# Patient Record
Sex: Male | Born: 1955 | Race: White | Hispanic: No | Smoking: Never smoker
Health system: Southern US, Community
[De-identification: ages and names within clinical notes are randomized; demographics above are authoritative.]

## PROBLEM LIST (undated history)

## (undated) DIAGNOSIS — C801 Malignant (primary) neoplasm, unspecified: Secondary | ICD-10-CM

## (undated) DIAGNOSIS — I71019 Dissection of thoracic aorta, unspecified: Secondary | ICD-10-CM

## (undated) DIAGNOSIS — M1990 Unspecified osteoarthritis, unspecified site: Secondary | ICD-10-CM

## (undated) HISTORY — PX: SHOULDER SURGERY: SHX246

## (undated) HISTORY — PX: OTHER SURGICAL HISTORY: SHX169

---

## 2003-11-01 ENCOUNTER — Encounter: Admission: RE | Admit: 2003-11-01 | Discharge: 2003-11-01 | Payer: Self-pay | Admitting: Family Medicine

## 2003-11-12 ENCOUNTER — Encounter: Admission: RE | Admit: 2003-11-12 | Discharge: 2003-11-12 | Payer: Self-pay | Admitting: Family Medicine

## 2004-05-12 ENCOUNTER — Encounter: Admission: RE | Admit: 2004-05-12 | Discharge: 2004-05-12 | Payer: Self-pay | Admitting: Family Medicine

## 2005-06-29 ENCOUNTER — Encounter: Admission: RE | Admit: 2005-06-29 | Discharge: 2005-06-29 | Payer: Self-pay | Admitting: Family Medicine

## 2010-06-14 ENCOUNTER — Encounter: Payer: Self-pay | Admitting: Family Medicine

## 2016-03-03 ENCOUNTER — Ambulatory Visit (INDEPENDENT_AMBULATORY_CARE_PROVIDER_SITE_OTHER): Payer: PRIVATE HEALTH INSURANCE | Admitting: Orthopaedic Surgery

## 2016-03-03 DIAGNOSIS — M1712 Unilateral primary osteoarthritis, left knee: Secondary | ICD-10-CM

## 2018-07-05 ENCOUNTER — Other Ambulatory Visit: Payer: Self-pay | Admitting: Urology

## 2018-07-05 DIAGNOSIS — R972 Elevated prostate specific antigen [PSA]: Secondary | ICD-10-CM

## 2018-07-19 ENCOUNTER — Ambulatory Visit
Admission: RE | Admit: 2018-07-19 | Discharge: 2018-07-19 | Disposition: A | Discharge status: Home / Self Care | Payer: PRIVATE HEALTH INSURANCE | Source: Ambulatory Visit | Attending: Urology | Admitting: Urology

## 2018-07-19 DIAGNOSIS — R972 Elevated prostate specific antigen [PSA]: Secondary | ICD-10-CM

## 2018-07-19 MED ORDER — GADOBENATE DIMEGLUMINE 529 MG/ML IV SOLN
15.0000 mL | Freq: Once | INTRAVENOUS | Status: AC | PRN
  Administered 2018-07-19: 15 mL via INTRAVENOUS

## 2019-01-05 ENCOUNTER — Other Ambulatory Visit: Payer: Self-pay | Admitting: Urology

## 2019-01-05 DIAGNOSIS — C61 Malignant neoplasm of prostate: Secondary | ICD-10-CM

## 2019-04-25 ENCOUNTER — Ambulatory Visit
Admission: RE | Admit: 2019-04-25 | Discharge: 2019-04-25 | Disposition: A | Discharge status: Home / Self Care | Payer: PRIVATE HEALTH INSURANCE | Source: Ambulatory Visit | Attending: Urology | Admitting: Urology

## 2019-04-25 DIAGNOSIS — C61 Malignant neoplasm of prostate: Secondary | ICD-10-CM

## 2019-04-25 MED ORDER — GADOBENATE DIMEGLUMINE 529 MG/ML IV SOLN
15.0000 mL | Freq: Once | INTRAVENOUS | Status: AC | PRN
  Administered 2019-04-25: 15 mL via INTRAVENOUS

## 2019-05-12 ENCOUNTER — Other Ambulatory Visit: Payer: Self-pay | Admitting: Urology

## 2019-06-06 ENCOUNTER — Other Ambulatory Visit (HOSPITAL_COMMUNITY)
Admission: RE | Admit: 2019-06-06 | Discharge: 2019-06-06 | Disposition: A | Discharge status: Home / Self Care | Payer: PRIVATE HEALTH INSURANCE | Source: Ambulatory Visit | Attending: Urology | Admitting: Urology

## 2019-06-06 DIAGNOSIS — Z01812 Encounter for preprocedural laboratory examination: Secondary | ICD-10-CM | POA: Diagnosis present

## 2019-06-06 DIAGNOSIS — Z20822 Contact with and (suspected) exposure to covid-19: Secondary | ICD-10-CM | POA: Insufficient documentation

## 2019-06-06 NOTE — Patient Instructions (Signed)
DUE TO COVID-19 ONLY ONE VISITOR IS ALLOWED TO COME WITH YOU AND STAY IN THE WAITING ROOM ONLY DURING PRE OP AND PROCEDURE DAY OF SURGERY. THE 1 VISITOR MAY VISIT WITH YOU AFTER SURGERY IN YOUR PRIVATE ROOM DURING VISITING HOURS ONLY!  YOU NEED TO HAVE A COVID 19 TEST ON_______ @_______ , THIS TEST MUST BE DONE BEFORE SURGERY, COME  [address], [place] , [id number].  ([place]) ONCE YOUR COVID TEST IS COMPLETED, PLEASE BEGIN THE QUARANTINE INSTRUCTIONS AS OUTLINED IN YOUR HANDOUT.                RAYMONE RENZI  06/06/2019   Your procedure is scheduled on: 06-09-19   Report to [facility] Main  Entrance   Report to short stay at        0530 AM     Call this number if you have problems the morning of surgery [phone]    Remember: Do not eat food or drink liquids :After Midnight.   BRUSH YOUR TEETH MORNING OF SURGERY AND RINSE YOUR MOUTH OUT, NO CHEWING GUM CANDY OR MINTS.     Take these medicines the morning of surgery with A SIP OF WATER: NONE unless you need a xanax                                 You may not have any metal on your body including hair pins and              piercings  Do not wear jewelry,  lotions, powders or perfumes, deodorant                     Men may shave face and neck.   Do not bring valuables to the hospital. [place].  Contacts, dentures or bridgework may not be worn into surgery.      Patients discharged the day of surgery will not be allowed to drive home. IF YOU ARE HAVING SURGERY AND GOING HOME THE SAME DAY, YOU MUST HAVE AN ADULT TO DRIVE YOU HOME AND BE WITH YOU FOR 24 HOURS. YOU MAY GO HOME BY TAXI OR UBER OR ORTHERWISE, BUT AN ADULT MUST ACCOMPANY YOU HOME AND STAY WITH YOU FOR 24 HOURS.  Name and phone number of your driver:  Special Instructions: N/A              Please read over the following fact sheets you were  given: _____________________________________________________________________             [facility] - Preparing for Surgery Before surgery, you can play an important role.  Because skin is not sterile, your skin needs to be as free of germs as possible.  You can reduce the number of germs on your skin by washing with CHG (chlorahexidine gluconate) soap before surgery.  CHG is an antiseptic cleaner which kills germs and bonds with the skin to continue killing germs even after washing. Please DO NOT use if you have an allergy to CHG or antibacterial soaps.  If your skin becomes reddened/irritated stop using the CHG and inform your nurse when you arrive at Short Stay. Do not shave (including legs and underarms) for at least 48 hours prior to the first CHG shower.  You may shave your  face/neck. Please follow these instructions carefully:  1.  Shower with CHG Soap the night before surgery and the  morning of Surgery.  2.  If you choose to wash your hair, wash your hair first as usual with your  normal  shampoo.  3.  After you shampoo, rinse your hair and body thoroughly to remove the  shampoo.                           4.  Use CHG as you would any other liquid soap.  You can apply chg directly  to the skin and wash                       Gently with a scrungie or clean washcloth.  5.  Apply the CHG Soap to your body ONLY FROM THE NECK DOWN.   Do not use on face/ open                           Wound or open sores. Avoid contact with eyes, ears mouth and genitals (private parts).                       Wash face,  Genitals (private parts) with your normal soap.             6.  Wash thoroughly, paying special attention to the area where your surgery  will be performed.  7.  Thoroughly rinse your body with warm water from the neck down.  8.  DO NOT shower/wash with your normal soap after using and rinsing off  the CHG Soap.                9.  Pat yourself dry with a clean towel.            10.  Wear  clean pajamas.            11.  Place clean sheets on your bed the night of your first shower and do not  sleep with pets. Day of Surgery : Do not apply any lotions/deodorants the morning of surgery.  Please wear clean clothes to the hospital/surgery center.  FAILURE TO FOLLOW THESE INSTRUCTIONS MAY RESULT IN THE CANCELLATION OF YOUR SURGERY PATIENT SIGNATURE_________________________________  NURSE SIGNATURE__________________________________  ________________________________________________________________________

## 2019-06-07 ENCOUNTER — Encounter (HOSPITAL_COMMUNITY): Payer: Self-pay

## 2019-06-07 ENCOUNTER — Other Ambulatory Visit: Payer: Self-pay

## 2019-06-07 ENCOUNTER — Encounter (HOSPITAL_COMMUNITY)
Admission: RE | Admit: 2019-06-07 | Discharge: 2019-06-07 | Disposition: A | Discharge status: Home / Self Care | Payer: PRIVATE HEALTH INSURANCE | Source: Ambulatory Visit | Attending: Urology | Admitting: Urology

## 2019-06-07 DIAGNOSIS — Z01812 Encounter for preprocedural laboratory examination: Secondary | ICD-10-CM | POA: Insufficient documentation

## 2019-06-07 HISTORY — DX: Malignant (primary) neoplasm, unspecified: C80.1

## 2019-06-07 HISTORY — DX: Unspecified osteoarthritis, unspecified site: M19.90

## 2019-06-07 LAB — ABO/RH: ABO/RH(D): A POS

## 2019-06-07 LAB — COMPREHENSIVE METABOLIC PANEL
ALT: 38 U/L (ref 0–44)
AST: 31 U/L (ref 15–41)
Albumin: 4.3 g/dL (ref 3.5–5.0)
Alkaline Phosphatase: 63 U/L (ref 38–126)
Anion gap: 8 (ref 5–15)
BUN: 14 mg/dL (ref 8–23)
CO2: 25 mmol/L (ref 22–32)
Calcium: 9.1 mg/dL (ref 8.9–10.3)
Chloride: 104 mmol/L (ref 98–111)
Creatinine, Ser: 0.84 mg/dL (ref 0.61–1.24)
GFR calc Af Amer: >60 mL/min (ref >60)
GFR calc non Af Amer: >60 mL/min (ref >60)
Glucose, Bld: 95 mg/dL (ref 70–99)
Potassium: 4.1 mmol/L (ref 3.5–5.1)
Sodium: 137 mmol/L (ref 135–145)
Total Bilirubin: 0.9 mg/dL (ref 0.3–1.2)
Total Protein: 6.8 g/dL (ref 6.5–8.1)

## 2019-06-07 LAB — CBC
HCT: 48.7 % (ref 39.0–52.0)
Hemoglobin: 15.5 g/dL (ref 13.0–17.0)
MCH: 30.9 pg (ref 26.0–34.0)
MCHC: 31.8 g/dL (ref 30.0–36.0)
MCV: 97 fL (ref 80.0–100.0)
Platelets: 261 10*3/uL (ref 150–400)
RBC: 5.02 MIL/uL (ref 4.22–5.81)
RDW: 12.8 % (ref 11.5–15.5)
WBC: 5.7 10*3/uL (ref 4.0–10.5)
nRBC: 0 % (ref 0.0–0.2)

## 2019-06-07 LAB — NOVEL CORONAVIRUS, NAA (HOSP ORDER, SEND-OUT TO REF LAB; TAT 18-24 HRS): SARS-CoV-2, NAA: NOT DETECTED

## 2019-06-07 NOTE — Progress Notes (Signed)
PCP - Dr. Stephanie Acre, [facility] Cardiologist -   Chest x-ray -  EKG -  Stress Test -  ECHO -  Cardiac Cath -   Sleep Study -  CPAP -   Fasting Blood Sugar -  Checks Blood Sugar _____ times a day  Blood Thinner Instructions: 81mg  asa stoppes 06-01-18 Aspirin Instructions: Last Dose:  Anesthesia review:   Patient denies shortness of breath, fever, cough and chest pain at PAT appointment   DENIES   Patient verbalized understanding of instructions that were given to them at the PAT appointment. Patient was also instructed that they will need to review over the PAT instructions again at home before surgery.

## 2019-06-07 NOTE — Patient Instructions (Signed)
DUE TO COVID-19 ONLY ONE VISITOR IS ALLOWED TO COME WITH YOU AND STAY IN THE WAITING ROOM ONLY DURING PRE OP AND PROCEDURE DAY OF SURGERY. THE 1 VISITOR MAY VISIT WITH YOU AFTER SURGERY IN YOUR PRIVATE ROOM DURING VISITING HOURS ONLY!  YOU NEED TO HAVE A COVID 19 TEST ON_______ @_______ , THIS TEST MUST BE DONE BEFORE SURGERY, COME  [place], [place] , [id number].  ([place]) ONCE YOUR COVID TEST IS COMPLETED, PLEASE BEGIN THE QUARANTINE INSTRUCTIONS AS OUTLINED IN YOUR HANDOUT.                LANCE MONTFORD  06/07/2019   Your procedure is scheduled on:    Report to [facility] Main  Entrance   Report to admitting at AM     Call this number if you have problems the morning of surgery [phone]    Remember: Do not eat food or drink liquids :After Midnight. BRUSH YOUR TEETH MORNING OF SURGERY AND RINSE YOUR MOUTH OUT, NO CHEWING GUM CANDY OR MINTS.     Take these medicines the morning of surgery with A SIP OF WATER:   DO NOT TAKE ANY DIABETIC MEDICATIONS DAY OF YOUR SURGERY                               You may not have any metal on your body including hair pins and              piercings  Do not wear jewelry, make-up, lotions, powders or perfumes, deodorant             Do not wear nail polish on your fingernails.  Do not shave  48 hours prior to surgery.              Men may shave face and neck.   Do not bring valuables to the hospital. [place].  Contacts, dentures or bridgework may not be worn into surgery.  Leave suitcase in the car. After surgery it may be brought to your room.     Patients discharged the day of surgery will not be allowed to drive home. IF YOU ARE HAVING SURGERY AND GOING HOME THE SAME DAY, YOU MUST HAVE AN ADULT TO DRIVE YOU HOME AND BE WITH YOU FOR 24 HOURS. YOU MAY GO HOME BY TAXI OR UBER OR ORTHERWISE, BUT AN ADULT MUST ACCOMPANY YOU HOME AND STAY WITH YOU FOR 24  HOURS.  Name and phone number of your driver:  Special Instructions: N/A              Please read over the following fact sheets you were given: _____________________________________________________________________

## 2019-06-08 ENCOUNTER — Encounter (HOSPITAL_COMMUNITY): Payer: Self-pay | Admitting: Urology

## 2019-06-08 LAB — URINE CULTURE: Culture: NO GROWTH

## 2019-06-08 NOTE — Anesthesia Preprocedure Evaluation (Addendum)
Anesthesia Evaluation  Patient identified by MRN, date of birth, ID band Patient awake    Reviewed: Allergy & Precautions, NPO status , Patient's Chart, lab work & pertinent test results  Airway Mallampati: I       Dental no notable dental hx. (+) Teeth Intact   Pulmonary neg pulmonary ROS,    Pulmonary exam normal breath sounds clear to auscultation       Cardiovascular Normal cardiovascular exam Rhythm:Regular Rate:Normal     Neuro/Psych negative neurological ROS     GI/Hepatic negative GI ROS, Neg liver ROS,   Endo/Other  negative endocrine ROS  Renal/GU negative Renal ROS     Musculoskeletal   Abdominal Normal abdominal exam  (+)   Peds  Hematology negative hematology ROS (+)   Anesthesia Other Findings   Reproductive/Obstetrics                            Anesthesia Physical Anesthesia Plan  ASA: II  Anesthesia Plan: General   Post-op Pain Management:    Induction: Intravenous  PONV Risk Score and Plan: 4 or greater and Ondansetron, Dexamethasone and Midazolam  Airway Management Planned: Oral ETT  Additional Equipment: None  Intra-op Plan:   Post-operative Plan: Extubation in OR  Informed Consent: I have reviewed the patients History and Physical, chart, labs and discussed the procedure including the risks, benefits and alternatives for the proposed anesthesia with the patient or authorized representative who has indicated his/her understanding and acceptance.     Dental advisory given  Plan Discussed with: CRNA  Anesthesia Plan Comments:        Anesthesia Quick Evaluation

## 2019-06-09 ENCOUNTER — Encounter (HOSPITAL_COMMUNITY)
Admission: AD | Disposition: A | Discharge status: Home / Self Care | Payer: Self-pay | Source: Ambulatory Visit | Attending: Urology

## 2019-06-09 ENCOUNTER — Ambulatory Visit (HOSPITAL_COMMUNITY): Payer: PRIVATE HEALTH INSURANCE | Admitting: Physician Assistant

## 2019-06-09 ENCOUNTER — Other Ambulatory Visit: Payer: Self-pay

## 2019-06-09 ENCOUNTER — Encounter (HOSPITAL_COMMUNITY): Payer: Self-pay | Admitting: Urology

## 2019-06-09 ENCOUNTER — Inpatient Hospital Stay (HOSPITAL_COMMUNITY)
Admission: AD | Admit: 2019-06-09 | Discharge: 2019-06-12 | DRG: 708 | Disposition: A | Discharge status: Home / Self Care | Payer: PRIVATE HEALTH INSURANCE | Attending: Urology | Admitting: Urology

## 2019-06-09 DIAGNOSIS — Z8042 Family history of malignant neoplasm of prostate: Secondary | ICD-10-CM

## 2019-06-09 DIAGNOSIS — M1991 Primary osteoarthritis, unspecified site: Secondary | ICD-10-CM | POA: Diagnosis present

## 2019-06-09 DIAGNOSIS — N529 Male erectile dysfunction, unspecified: Secondary | ICD-10-CM | POA: Diagnosis present

## 2019-06-09 DIAGNOSIS — Z79899 Other long term (current) drug therapy: Secondary | ICD-10-CM

## 2019-06-09 DIAGNOSIS — C61 Malignant neoplasm of prostate: Secondary | ICD-10-CM

## 2019-06-09 DIAGNOSIS — E7800 Pure hypercholesterolemia, unspecified: Secondary | ICD-10-CM | POA: Diagnosis present

## 2019-06-09 DIAGNOSIS — Z7982 Long term (current) use of aspirin: Secondary | ICD-10-CM

## 2019-06-09 HISTORY — PX: SMALL BOWEL REPAIR: SHX6447

## 2019-06-09 HISTORY — PX: PELVIC LYMPH NODE DISSECTION: SHX6543

## 2019-06-09 HISTORY — PX: ROBOT ASSISTED LAPAROSCOPIC RADICAL PROSTATECTOMY: SHX5141

## 2019-06-09 LAB — TYPE AND SCREEN
ABO/RH(D): A POS
Antibody Screen: NEGATIVE

## 2019-06-09 LAB — HEMOGLOBIN AND HEMATOCRIT, BLOOD
HCT: 40.8 % (ref 39.0–52.0)
Hemoglobin: 12.8 g/dL — ABNORMAL LOW (ref 13.0–17.0)

## 2019-06-09 SURGERY — PROSTATECTOMY, RADICAL, ROBOT-ASSISTED, LAPAROSCOPIC
Anesthesia: General | Site: Abdomen

## 2019-06-09 MED ORDER — HYDROMORPHONE HCL 1 MG/ML IJ SOLN: 0.5000 mg | INTRAMUSCULAR | Status: DC | PRN

## 2019-06-09 MED ORDER — BUPIVACAINE-EPINEPHRINE 0.5% -1:200000 IJ SOLN
INTRAMUSCULAR | Status: AC
  Filled 2019-06-09: qty 1

## 2019-06-09 MED ORDER — ONDANSETRON HCL 4 MG/2ML IJ SOLN
INTRAMUSCULAR | Status: DC | PRN
  Administered 2019-06-09 (×2): 4 mg via INTRAVENOUS

## 2019-06-09 MED ORDER — ROCURONIUM BROMIDE 10 MG/ML (PF) SYRINGE
PREFILLED_SYRINGE | INTRAVENOUS | Status: AC
  Filled 2019-06-09: qty 10

## 2019-06-09 MED ORDER — DIPHENHYDRAMINE HCL 12.5 MG/5ML PO ELIX: 12.5000 mg | ORAL_SOLUTION | Freq: Four times a day (QID) | ORAL | Status: DC | PRN

## 2019-06-09 MED ORDER — CEFAZOLIN SODIUM-DEXTROSE 2-4 GM/100ML-% IV SOLN
INTRAVENOUS | Status: AC
  Filled 2019-06-09: qty 100

## 2019-06-09 MED ORDER — ATORVASTATIN CALCIUM 20 MG PO TABS
20.0000 mg | ORAL_TABLET | Freq: Every day | ORAL | Status: DC
  Administered 2019-06-09 – 2019-06-11 (×3): 20 mg via ORAL
  Filled 2019-06-09 (×3): qty 1

## 2019-06-09 MED ORDER — SODIUM CHLORIDE 0.9 % IV BOLUS
1000.0000 mL | Freq: Once | INTRAVENOUS | Status: AC
  Administered 2019-06-09: 12:00:00 1000 mL via INTRAVENOUS

## 2019-06-09 MED ORDER — BACITRACIN-NEOMYCIN-POLYMYXIN 400-5-5000 EX OINT: 1.0000 "application " | TOPICAL_OINTMENT | Freq: Three times a day (TID) | CUTANEOUS | Status: DC | PRN

## 2019-06-09 MED ORDER — MIDAZOLAM HCL 2 MG/2ML IJ SOLN
INTRAMUSCULAR | Status: DC | PRN
  Administered 2019-06-09: 2 mg via INTRAVENOUS

## 2019-06-09 MED ORDER — TRAMADOL HCL 50 MG PO TABS
50.0000 mg | ORAL_TABLET | Freq: Four times a day (QID) | ORAL | Status: DC | PRN
  Administered 2019-06-11: 50 mg via ORAL
  Administered 2019-06-12: 100 mg via ORAL
  Filled 2019-06-09: qty 2
  Filled 2019-06-09: qty 1

## 2019-06-09 MED ORDER — PHENYLEPHRINE HCL-NACL 10-0.9 MG/250ML-% IV SOLN
INTRAVENOUS | Status: DC | PRN
  Administered 2019-06-09: 50 ug/min via INTRAVENOUS

## 2019-06-09 MED ORDER — PROMETHAZINE HCL 25 MG/ML IJ SOLN: 6.2500 mg | INTRAMUSCULAR | Status: DC | PRN

## 2019-06-09 MED ORDER — ACETAMINOPHEN 10 MG/ML IV SOLN
1000.0000 mg | Freq: Four times a day (QID) | INTRAVENOUS | Status: AC
  Administered 2019-06-09 – 2019-06-10 (×3): 1000 mg via INTRAVENOUS
  Filled 2019-06-09 (×4): qty 100

## 2019-06-09 MED ORDER — BUPIVACAINE LIPOSOME 1.3 % IJ SUSP
20.0000 mL | Freq: Once | INTRAMUSCULAR | Status: AC
  Administered 2019-06-09: 20 mL
  Filled 2019-06-09: qty 20

## 2019-06-09 MED ORDER — HEMOSTATIC AGENTS (NO CHARGE) OPTIME
TOPICAL | Status: DC | PRN
  Administered 2019-06-09: 1 via TOPICAL

## 2019-06-09 MED ORDER — SULFAMETHOXAZOLE-TRIMETHOPRIM 800-160 MG PO TABS: 1.0000 | ORAL_TABLET | Freq: Two times a day (BID) | ORAL | 0 refills | Status: DC

## 2019-06-09 MED ORDER — BELLADONNA ALKALOIDS-OPIUM 16.2-60 MG RE SUPP: 1.0000 | Freq: Four times a day (QID) | RECTAL | Status: DC | PRN

## 2019-06-09 MED ORDER — CEFAZOLIN SODIUM-DEXTROSE 1-4 GM/50ML-% IV SOLN
1.0000 g | Freq: Three times a day (TID) | INTRAVENOUS | Status: DC
  Administered 2019-06-09 – 2019-06-11 (×5): 1 g via INTRAVENOUS
  Filled 2019-06-09 (×7): qty 50

## 2019-06-09 MED ORDER — PROPOFOL 10 MG/ML IV BOLUS
INTRAVENOUS | Status: AC
  Filled 2019-06-09: qty 20

## 2019-06-09 MED ORDER — HYDROMORPHONE HCL 1 MG/ML IJ SOLN
INTRAMUSCULAR | Status: AC
  Filled 2019-06-09: qty 1

## 2019-06-09 MED ORDER — SUGAMMADEX SODIUM 500 MG/5ML IV SOLN
INTRAVENOUS | Status: AC
  Filled 2019-06-09: qty 5

## 2019-06-09 MED ORDER — ROCURONIUM BROMIDE 10 MG/ML (PF) SYRINGE
PREFILLED_SYRINGE | INTRAVENOUS | Status: DC | PRN
  Administered 2019-06-09: 10 mg via INTRAVENOUS
  Administered 2019-06-09: 20 mg via INTRAVENOUS
  Administered 2019-06-09: 10 mg via INTRAVENOUS
  Administered 2019-06-09: 20 mg via INTRAVENOUS
  Administered 2019-06-09: 70 mg via INTRAVENOUS

## 2019-06-09 MED ORDER — DIPHENHYDRAMINE HCL 50 MG/ML IJ SOLN: 12.5000 mg | Freq: Four times a day (QID) | INTRAMUSCULAR | Status: DC | PRN

## 2019-06-09 MED ORDER — ALPRAZOLAM 1 MG PO TABS
1.0000 mg | ORAL_TABLET | Freq: Every evening | ORAL | Status: DC | PRN
  Administered 2019-06-09 – 2019-06-11 (×3): 1 mg via ORAL
  Filled 2019-06-09 (×3): qty 1

## 2019-06-09 MED ORDER — LIDOCAINE 2% (20 MG/ML) 5 ML SYRINGE
INTRAMUSCULAR | Status: DC | PRN
  Administered 2019-06-09: 100 mg via INTRAVENOUS

## 2019-06-09 MED ORDER — KETOROLAC TROMETHAMINE 15 MG/ML IJ SOLN
15.0000 mg | Freq: Four times a day (QID) | INTRAMUSCULAR | Status: AC
  Administered 2019-06-09 – 2019-06-10 (×5): 15 mg via INTRAVENOUS
  Filled 2019-06-09 (×5): qty 1

## 2019-06-09 MED ORDER — CEFAZOLIN SODIUM-DEXTROSE 2-4 GM/100ML-% IV SOLN
2.0000 g | INTRAVENOUS | Status: AC
  Administered 2019-06-09 (×2): 2 g via INTRAVENOUS
  Filled 2019-06-09: qty 100

## 2019-06-09 MED ORDER — ACETAMINOPHEN 10 MG/ML IV SOLN
1000.0000 mg | Freq: Four times a day (QID) | INTRAVENOUS | Status: DC
  Filled 2019-06-09: qty 100

## 2019-06-09 MED ORDER — FENTANYL CITRATE (PF) 250 MCG/5ML IJ SOLN
INTRAMUSCULAR | Status: DC | PRN
  Administered 2019-06-09: 50 ug via INTRAVENOUS
  Administered 2019-06-09 (×2): 25 ug via INTRAVENOUS
  Administered 2019-06-09: 200 ug via INTRAVENOUS
  Administered 2019-06-09: 100 ug via INTRAVENOUS
  Administered 2019-06-09 (×2): 50 ug via INTRAVENOUS

## 2019-06-09 MED ORDER — MIDAZOLAM HCL 2 MG/2ML IJ SOLN
INTRAMUSCULAR | Status: AC
  Filled 2019-06-09: qty 2

## 2019-06-09 MED ORDER — DOCUSATE SODIUM 100 MG PO CAPS
100.0000 mg | ORAL_CAPSULE | Freq: Two times a day (BID) | ORAL | Status: DC
  Administered 2019-06-09 – 2019-06-11 (×5): 100 mg via ORAL
  Filled 2019-06-09 (×5): qty 1

## 2019-06-09 MED ORDER — SUGAMMADEX SODIUM 500 MG/5ML IV SOLN
INTRAVENOUS | Status: DC | PRN
  Administered 2019-06-09: 300 mg via INTRAVENOUS

## 2019-06-09 MED ORDER — FENTANYL CITRATE (PF) 250 MCG/5ML IJ SOLN
INTRAMUSCULAR | Status: AC
  Filled 2019-06-09: qty 5

## 2019-06-09 MED ORDER — DEXTROSE-NACL 5-0.45 % IV SOLN: INTRAVENOUS | Status: DC

## 2019-06-09 MED ORDER — MEPERIDINE HCL 50 MG/ML IJ SOLN: 6.2500 mg | INTRAMUSCULAR | Status: DC | PRN

## 2019-06-09 MED ORDER — HYDROMORPHONE HCL 1 MG/ML IJ SOLN
0.2500 mg | INTRAMUSCULAR | Status: DC | PRN
  Administered 2019-06-09 (×2): 0.5 mg via INTRAVENOUS

## 2019-06-09 MED ORDER — EPHEDRINE SULFATE-NACL 50-0.9 MG/10ML-% IV SOSY
PREFILLED_SYRINGE | INTRAVENOUS | Status: DC | PRN
  Administered 2019-06-09 (×2): 10 mg via INTRAVENOUS

## 2019-06-09 MED ORDER — TRAMADOL HCL 50 MG PO TABS: 50.0000 mg | ORAL_TABLET | Freq: Four times a day (QID) | ORAL | 0 refills | Status: DC | PRN

## 2019-06-09 MED ORDER — LACTATED RINGERS IR SOLN
Status: DC | PRN
  Administered 2019-06-09: 1000 mL

## 2019-06-09 MED ORDER — ONDANSETRON HCL 4 MG/2ML IJ SOLN: 4.0000 mg | INTRAMUSCULAR | Status: DC | PRN

## 2019-06-09 MED ORDER — LACTATED RINGERS IV SOLN
INTRAVENOUS | Status: DC
  Administered 2019-06-09: 12:00:00 1000 mL via INTRAVENOUS

## 2019-06-09 MED ORDER — DEXAMETHASONE SODIUM PHOSPHATE 10 MG/ML IJ SOLN
INTRAMUSCULAR | Status: DC | PRN
  Administered 2019-06-09: 10 mg via INTRAVENOUS

## 2019-06-09 MED ORDER — PROPOFOL 10 MG/ML IV BOLUS
INTRAVENOUS | Status: DC | PRN
  Administered 2019-06-09: 150 mg via INTRAVENOUS

## 2019-06-09 MED ORDER — BUPIVACAINE-EPINEPHRINE 0.5% -1:200000 IJ SOLN
INTRAMUSCULAR | Status: DC | PRN
  Administered 2019-06-09: 20 mL

## 2019-06-09 MED ORDER — KETOROLAC TROMETHAMINE 30 MG/ML IJ SOLN: 30.0000 mg | Freq: Once | INTRAMUSCULAR | Status: DC | PRN

## 2019-06-09 SURGICAL SUPPLY — 66 items
ADH SKN CLS APL DERMABOND .7 (GAUZE/BANDAGES/DRESSINGS) ×3
APL PRP STRL LF DISP 70% ISPRP (MISCELLANEOUS) ×3
APL SRG 38 LTWT LNG FL B (MISCELLANEOUS) ×3
APPLICATOR ARISTA FLEXITIP XL (MISCELLANEOUS) ×2 IMPLANT
CATH FOLEY 2WAY SLVR 18FR 30CC (CATHETERS) ×5 IMPLANT
CATH ROBINSON RED A/P 16FR (CATHETERS) ×2 IMPLANT
CATH TIEMANN FOLEY 18FR 5CC (CATHETERS) ×5 IMPLANT
CHLORAPREP W/TINT 26 (MISCELLANEOUS) ×5 IMPLANT
CLIP VESOLOCK LG 6/CT PURPLE (CLIP) ×12 IMPLANT
COVER SURGICAL LIGHT HANDLE (MISCELLANEOUS) ×5 IMPLANT
COVER TIP SHEARS 8 DVNC (MISCELLANEOUS) ×3 IMPLANT
COVER TIP SHEARS 8MM DA VINCI (MISCELLANEOUS) ×2
COVER WAND RF STERILE (DRAPES) IMPLANT
CUTTER ECHEON FLEX ENDO 45 340 (ENDOMECHANICALS) ×5 IMPLANT
DECANTER SPIKE VIAL GLASS SM (MISCELLANEOUS) ×5 IMPLANT
DERMABOND ADVANCED (GAUZE/BANDAGES/DRESSINGS) ×2
DERMABOND ADVANCED .7 DNX12 (GAUZE/BANDAGES/DRESSINGS) ×3 IMPLANT
DRAIN CHANNEL RND F F (WOUND CARE) IMPLANT
DRAPE ARM DVNC X/XI (DISPOSABLE) ×12 IMPLANT
DRAPE COLUMN DVNC XI (DISPOSABLE) ×3 IMPLANT
DRAPE DA VINCI XI ARM (DISPOSABLE) ×8
DRAPE DA VINCI XI COLUMN (DISPOSABLE) ×2
DRAPE SURG IRRIG POUCH 19X23 (DRAPES) ×5 IMPLANT
DRSG TEGADERM 4X4.75 (GAUZE/BANDAGES/DRESSINGS) ×5 IMPLANT
ELECT REM PT RETURN 15FT ADLT (MISCELLANEOUS) ×5 IMPLANT
GAUZE 4X4 16PLY RFD (DISPOSABLE) IMPLANT
GAUZE SPONGE 2X2 8PLY STRL LF (GAUZE/BANDAGES/DRESSINGS) IMPLANT
GLOVE BIO SURGEON STRL SZ 6.5 (GLOVE) ×5 IMPLANT
GLOVE BIO SURGEONS STRL SZ 6.5 (GLOVE) ×2
GLOVE BIOGEL M STRL SZ7.5 (GLOVE) ×10 IMPLANT
GOWN STRL REUS W/TWL LRG LVL3 (GOWN DISPOSABLE) ×7 IMPLANT
GOWN STRL REUS W/TWL XL LVL3 (GOWN DISPOSABLE) ×10 IMPLANT
HEMOSTAT ARISTA ABSORB 3G PWDR (HEMOSTASIS) ×2 IMPLANT
HOLDER FOLEY CATH W/STRAP (MISCELLANEOUS) ×5 IMPLANT
IRRIG SUCT STRYKERFLOW 2 WTIP (MISCELLANEOUS) ×5
IRRIGATION SUCT STRKRFLW 2 WTP (MISCELLANEOUS) ×3 IMPLANT
IV LACTATED RINGERS 1000ML (IV SOLUTION) ×5 IMPLANT
KIT TURNOVER KIT A (KITS) ×2 IMPLANT
MARKER SKIN DUAL TIP RULER LAB (MISCELLANEOUS) ×2 IMPLANT
PACK ROBOT UROLOGY CUSTOM (CUSTOM PROCEDURE TRAY) ×5 IMPLANT
PAD POSITIONING PINK XL (MISCELLANEOUS) ×5 IMPLANT
PENCIL SMOKE EVACUATOR (MISCELLANEOUS) IMPLANT
RELOAD STAPLE 45 4.1 GRN THCK (STAPLE) ×3 IMPLANT
SEAL CANN UNIV 5-8 DVNC XI (MISCELLANEOUS) ×12 IMPLANT
SEAL XI 5MM-8MM UNIVERSAL (MISCELLANEOUS) ×8
SET TUBE SMOKE EVAC HIGH FLOW (TUBING) ×5 IMPLANT
SOLUTION ELECTROLUBE (MISCELLANEOUS) ×5 IMPLANT
SPONGE GAUZE 2X2 STER 10/PKG (GAUZE/BANDAGES/DRESSINGS)
STAPLE RELOAD 45 GRN (STAPLE) ×3 IMPLANT
STAPLE RELOAD 45MM GREEN (STAPLE) ×5
SUT ETHILON 3 0 PS 1 (SUTURE) ×5 IMPLANT
SUT MNCRL AB 4-0 PS2 18 (SUTURE) ×10 IMPLANT
SUT SILK 3 0 SH 30 (SUTURE) ×2 IMPLANT
SUT V-LOC BARB 180 2/0GR6 GS22 (SUTURE) ×5
SUT VIC AB 0 CT1 27 (SUTURE) ×5
SUT VIC AB 0 CT1 27XBRD ANTBC (SUTURE) ×3 IMPLANT
SUT VIC AB 3-0 SH 27 (SUTURE) ×15
SUT VIC AB 3-0 SH 27X BRD (SUTURE) IMPLANT
SUT VIC AB 3-0 SH 27XBRD (SUTURE) IMPLANT
SUT VICRYL 0 UR6 27IN ABS (SUTURE) ×10 IMPLANT
SUT VLOC BARB 180 ABS3/0GR12 (SUTURE) ×10
SUTURE V-LC BRB 180 2/0GR6GS22 (SUTURE) ×3 IMPLANT
SUTURE VLOC BRB 180 ABS3/0GR12 (SUTURE) ×6 IMPLANT
TOWEL OR NON WOVEN STRL DISP B (DISPOSABLE) ×5 IMPLANT
TROCAR XCEL NON-BLD 5MMX100MML (ENDOMECHANICALS) IMPLANT
WATER STERILE IRR 1000ML POUR (IV SOLUTION) ×5 IMPLANT

## 2019-06-09 NOTE — Transfer of Care (Signed)
Immediate Anesthesia Transfer of Care Note  Patient: Ryan Hampton  Procedure(s) Performed: XI ROBOTIC ASSISTED LAPAROSCOPIC RADICAL PROSTATECTOMY (N/A ) PELVIC LYMPH NODE DISSECTION (Bilateral ) enterotomy repair (N/A Abdomen)  Patient Location: PACU  Anesthesia Type:General  Level of Consciousness: sedated  Airway & Oxygen Therapy: Patient Spontanous Breathing and Patient connected to face mask oxygen  Post-op Assessment: Report given to RN and Post -op Vital signs reviewed and stable  Post vital signs: Reviewed and stable  Last Vitals:  Vitals Value Taken Time  BP 130/82 06/09/19 1215  Temp    Pulse 84 06/09/19 1217  Resp 15 06/09/19 1217  SpO2 100 % 06/09/19 1217  Vitals shown include unvalidated device data.  Last Pain:  Vitals:   06/09/19 0629  TempSrc: Oral         Complications: No apparent anesthesia complications

## 2019-06-09 NOTE — Anesthesia Procedure Notes (Signed)
Date/Time: 06/09/2019 12:02 PM Performed by: Cynda Familia, CRNA Oxygen Delivery Method: Simple face mask Placement Confirmation: positive ETCO2 and breath sounds checked- equal and bilateral Dental Injury: Teeth and Oropharynx as per pre-operative assessment

## 2019-06-09 NOTE — Anesthesia Postprocedure Evaluation (Signed)
Anesthesia Post Note  Patient: Ryan Hampton  Procedure(s) Performed: XI ROBOTIC ASSISTED LAPAROSCOPIC RADICAL PROSTATECTOMY (N/A ) PELVIC LYMPH NODE DISSECTION (Bilateral ) enterotomy repair (N/A Abdomen)     Patient location during evaluation: PACU Anesthesia Type: General Level of consciousness: sedated Pain management: pain level controlled Vital Signs Assessment: post-procedure vital signs reviewed and stable Respiratory status: spontaneous breathing Cardiovascular status: stable Postop Assessment: no apparent nausea or vomiting Anesthetic complications: no    Last Vitals:  Vitals:   06/09/19 1330 06/09/19 1345  BP: 125/77 111/71  Pulse: 85 72  Resp: 11 (!) 9  Temp:    SpO2: 98% 99%    Last Pain:  Vitals:   06/09/19 1345  TempSrc:   PainSc: Asleep   Pain Goal:                   Huston Foley

## 2019-06-09 NOTE — Interval H&P Note (Signed)
History and Physical Interval Note:  06/09/2019 7:28 AM  Ryan Hampton  has presented today for surgery, with the diagnosis of PROSTATE CANCER.  The various methods of treatment have been discussed with the patient and family. After consideration of risks, benefits and other options for treatment, the patient has consented to  Procedure(s): XI ROBOTIC ASSISTED LAPAROSCOPIC RADICAL PROSTATECTOMY (N/A) PELVIC LYMPH NODE DISSECTION (Bilateral) as a surgical intervention.  The patient's history has been reviewed, patient examined, no change in status, stable for surgery.  I have reviewed the patient's chart and labs.  Questions were answered to the patient's satisfaction.     Ardis Hughs

## 2019-06-09 NOTE — Op Note (Signed)
Intraoperative consult:  I was called by the operating room and Dr. Louis Meckel for intraoperative consult. The patient was undergoing robotic prostatectomy and had a small intestine injury on entrance. The injury was full thickness and sharp without energy. We discussed options and sutured the injury with vicryl in a transverse manner and then Dr. Louis Meckel imbricated the repair with interrupted vicryl. At the end of the case we brought the repair out through the extraction port for examination. The repair was intact with a patent lumen. 2 additional Lembert sutures were placed on the lateral aspects of the repair. Please see Dr. Carlton Adam note for further details.

## 2019-06-09 NOTE — Op Note (Signed)
Preoperative diagnosis:  1. Prostate Cancer   Postoperative diagnosis:  1. same   Procedure: 1. Robotic assisted laparoscopic radical prostatectomy 2. Bilateral pelvic lymph node dissection  Surgeon: Ardis Hughs, MD First Assistant: Debbrah Alar, PA  Anesthesia: General  Complications:  Small intestine enterotomy occurred during port placement.  This was closed with 3-0 vicryls in two layers, with the assistance of Dr. Kieth Brightly from General Surgery.   Intraoperative findings:  Very adherent posterior plane from previous biopsies and prostatitis.   EBL: 300cc  Specimens:  #1.  Prostate and seminal vesicals #2.  Bilateral pelvic lymph nodes #3: Left lateral apical margin #4: Left medial base #5: Right posterior apex #6: Left lateral base #8: Medial base   Indication: REGIONAL Ryan Hampton is a 64 y.o. patient with prostate cancer.  After reviewing the management options for treatment, he elected to proceed with the removal of his prostate. We have discussed the potential benefits and risks of the procedure, side effects of the proposed treatment, the likelihood of the patient achieving the goals of the procedure, and any potential problems that might occur during the procedure or recuperation. Informed consent has been obtained.  Description of procedure:  The patient was consented in the preoperative holding area. He was in brought back to the operating room placed the table in supine position. General anesthesia was then induced and endotracheal tube was inserted. He was then placed in dorsolithotomy position and placed in steep Trendelenburg. He was then prepped and draped in the routine sterile fashion. We, the first assistant and I, then began by making a 10 mm incision supraumbilical midline incision the skin down through into the peritoneum. Then placed a 8 mm trocar. I then inflated the abdomen and inserted the 0 robotic lens. We then placed 2 additional a 8 millimeter  trochars in the patient's left lower abdomen proximally 9 cm apart and 2 trochars on the patient's right lower abdomen, one was an 8 mm trocar and the one most lateral was a 12 mm trocar which was used as the assistant port. A 5 mm trocar was placed by triangulating the 2 right lateral ports as a second assistant port. These ports were all placed under visual guidance. Once the ports were noted to be satisfactory position the robot was docked. We started with the 0 lens, monopolar scissors in the right hand and the [place] forceps the left hand as well as a fenestrated grasper as the third arm on the left-hand side.   Upon inspection, there was a small enterotomy in the intestine directly below the midline injury.  This appears to have occurred at the time of injury, which was done using Metzenbaum scissors.  At this point I consulted general surgery for assistance, please see Dr. Amie Portland note for more details.  Ultimately, we opted for primary closure which was done in the opposite direction of the opening using a 3-0 Vicryl in a running fashion.  I then performed Lembert imbrication using a 3-0 Vicryl.  We, the first assistant and I,  began our dissection the posterior plane incising the peritoneum at the level of the vas deferens. Isolated the left vas deferens and dissected it proximally towards the spermatic cord for 5 cm prior to ligating it. Then used this as traction to isolate the left the seminal vesicle which was then undressed bluntly and completely dissected out, all vessels were cauterized with a combination of bipolar and the monopolar scissors. We then turned our attention to the  right side and similarly dissected out the right vas deferens and seminal vesicle. Once the SVs had been freed, we turned our attention to the posterior plane and bluntly dissected the tissue between the rectum and the posterior wall of the prostate bluntly out towards the apex.    At this point the bladder was  taken down starting at the urachal remnant with a combination of both blunt dissection and sharp dissection using monopolar cautery the bladder was dropped down in the usual fashion to the medial umbilical ligaments laterally and the dorsal vein of the prostate anteriorly creating our space of Retzius. We then turned our attention to the endopelvic fascia which was incised laterally starting on the patient's right-hand side the levator muscles were pushed off the prostate laterally up towards the dorsal vein complex on the right-hand side. This process was then repeated on the left-hand side and a nice notch was created for the dorsal vein. I then used a 75mm stapler to staple the dorsal vein.   We,the first assistant and I, then located the bladder neck at the vesicoprostatic junction and using the monopolar scissors dissected down through the perivesical tissues and the bladder neck down to the prostatic urethra. The catheter was then deflated and pulled through our urethral opening and then used to retract the prostate anteriorly for the posterior bladder neck dissection. Once through the bladder neck and into the posterior plane of the prostate, the SVs were brought through the opening. The left pedicle was then isolated and systematically ligated with Weck clips and scissors. The nerve bundle was then peeled off the posterior lateral aspect of the prostate and bluntly dissected away off the prostate. This was then repeated on the right side.    I then came down through the dorsal venous complex anteriorly down to the membranous urethra using the monopolar. Once down to the urethra, it was transected sharply and the apex of the prostate was then dissected off the levator and rectourethralis muscles. Once the apex of the prostate had been dissected free we came back to the base of the prostate and bluntly push the rectum and nerve vascular bundle off the prostate the patient's left and used clips on the  patient's right to free the prostate. Once the prostate was free it was placed off to the side. The pelvis was then irrigated with normal saline and noted to be relatively hemostatic.  Attention was then turned to the right pelvic sidewall. The fibrofatty tissue between the external iliac vein, confluence of the iliac vessels, hypogastric artery, and Cooper's ligament was dissected free from the pelvic sidewall with care to preserve the obturator nerve. Weck clips were used for lymphostasis and hemostasis. An identical procedure was performed on the contralateral side and the lymphatic packets were removed for permanent pathologic analysis.  The prostate and both lymph node tissues were placed in the Endo Catch bag and the string brought to the 5 mm port.    The vesicourethral anastomosis was then completed with 2 interlocking 3-0 V. lock sutures running the anastomosis in the 6:00 position to the 12:00 position on each side and then tying it off on the top. The final catheter was then passed through the patient's urethra and into the bladder and 120 cc was instilled into the bladder to test the anastomosis. As there was no leak a 76 [place] Blake drain was passed through the left lateral port and placed around the vesicourethral anastomosis. A 12 mm assistant port on  the right lateral side was then closed with 0 Vicryl with the help of the [employer] needle. The 12 mm midline infraumbilical incision was then extended another centimeter taken down and the fascia opened to remove the Endo Catch bag with the prostate specimen.  We then inspected the small bowel repair which seemed to be intact, 2 more imbricating stitches were placed.  The lumen was palpated and appeared to be patent.  The fascia was then closed with a 0 Vicryl and all skin ports were closed with 4-0 Monocryl in a subcutaneous fashion. Dermabond glue was then applied to the incisions. The drain was then secured to the skin with a 0 nylon  stitch and dressing applied.   At the end of the case all laps needles and sponges had been accounted for. There no immediate complications. The patient returned to the PACU in stable condition.

## 2019-06-09 NOTE — Discharge Instructions (Signed)

## 2019-06-09 NOTE — Anesthesia Procedure Notes (Signed)
Procedure Name: Intubation Date/Time: 06/09/2019 7:38 AM Performed by: Cynda Familia, CRNA Pre-anesthesia Checklist: Patient identified, Emergency Drugs available, Suction available and Patient being monitored Patient Re-evaluated:Patient Re-evaluated prior to induction Oxygen Delivery Method: Circle System Utilized Preoxygenation: Pre-oxygenation with 100% oxygen Induction Type: IV induction Ventilation: Mask ventilation without difficulty Laryngoscope Size: Miller and 2 Grade View: Grade II Tube type: Oral Tube size: 7.5 mm Number of attempts: 1 Airway Equipment and Method: Stylet and Oral airway Placement Confirmation: ETT inserted through vocal cords under direct vision,  positive ETCO2 and breath sounds checked- equal and bilateral Secured at: 22 cm Tube secured with: Tape Dental Injury: Teeth and Oropharynx as per pre-operative assessment  Future Recommendations: Recommend- induction with short-acting agent, and alternative techniques readily available Comments: Smooth IV induction [place]- intubation AM CRNA atraumatic-- teeth and mouth as preop-- pt with anterior larynx-- consider Glidescope intubation-- bilat BS [place]

## 2019-06-09 NOTE — H&P (Signed)
Pt presents today for pre-operative history and physical exam in anticipation of robotic assisted lap radical prostatectomy with bilateral pelvic lymph node dissection on 06/09/19 by Dr. Louis Meckel. He is doing well and is without complaint. Pt denies F/C, HA, CP, SOB, N/V, diarrhea/constipation, back pain, flank pain, hematuria, and dysuria.    HX:  The patient presents today for consultation regarding his prostate cancer. 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 His PSA at the time of diagnosis was 7.91.   MRI: The patient has a PIRAD 3 lesion on the right anterior mid gland - biopsied (4+3=7). Volume 25 g approximately   The patient has minimal erectile dysfunction.   The patient takes cholesterol medicine, but no other and has no significant past medical history.   The patient has no surgical history.   The patient is divorced. His ex-wife is supportive. He has a daughter that lives in [place], no grandchildren.     ALLERGIES: None   MEDICATIONS: Finasteride 1 mg tablet  Levaquin 750 mg tablet 1 tablet PO Morning of procedure  Alprazolam 0.5 mg tablet  Aspirin Ec 81 mg tablet, delayed release  Atorvastatin Calcium 20 mg tablet  Multiple Vitamin  Vitamin C     GU PSH: Prostate Needle Biopsy - 05/01/2019, 08/19/2018, 09/16/2017       PSH Notes: shoulder surgeries  tonsilectomy   NON-GU PSH: Surgical Pathology, Gross And Microscopic Examination For Prostate Needle - 05/01/2019, 08/19/2018, 09/16/2017     GU PMH: Prostate Cancer - 05/01/2019, Very low volume, low/intermediate risk prostate cancer. 1/16 cores from most recent fusion biopsy revealed GS 3+4 pattern. Oncotype DX consistent with intermediate risk prostate cancer, - 11/16/2018, Low/intermediate risk prostate cancer with GS 3+4 in 20% of 1/12 systematic cores at recent biopsy. All 4 cores from region of interest were negative. He has excellent urinary  function, and no significant problems with ED. Kattan nomogram predictions-organ confined disease--52% Seminal vesicle/lymph node involvement--2% in each Progression free probability after radical prostatectomy at 5/10 years--85/75%, respectively, - 08/26/2018 Elevated PSA - 08/19/2018, (Stable), Negative biopsy in April, 2019. PSA is up a bit, however. Benign exam., - 04/06/2018, - 09/22/2017, - 09/16/2017, - 07/28/2017    NON-GU PMH: Arthritis Hypercholesterolemia    FAMILY HISTORY: Kidney Failure - Mother Prostate Cancer - Grandfather   SOCIAL HISTORY: Marital Status: Single Preferred Language: English; Ethnicity: Not Hispanic Or Latino; Race: White Current Smoking Status: Patient has never smoked.   Tobacco Use Assessment Completed: Used Tobacco in last 30 days? Does not use smokeless tobacco. Social Drinker.  Does not use drugs. Drinks 1 caffeinated drink per day. Has not had a blood transfusion. Patient's occupation Sales executive truck parts.     Notes: ETOH occasionally    REVIEW OF SYSTEMS:    GU Review Male:   Patient denies frequent urination, hard to postpone urination, burning/ pain with urination, get up at night to urinate, leakage of urine, stream starts and stops, trouble starting your stream, have to strain to urinate , erection problems, and penile pain.  Gastrointestinal (Upper):   Patient denies nausea, vomiting, and indigestion/ heartburn.  Gastrointestinal (Lower):   Patient denies diarrhea and constipation.  Constitutional:   Patient denies fever, night sweats, weight loss, and fatigue.  Skin:   Patient denies skin rash/ lesion and itching.  Eyes:   Patient denies blurred vision and double vision.  Ears/ Nose/ Throat:   Patient denies sore throat and  sinus problems.  Hematologic/Lymphatic:   Patient denies swollen glands and easy bruising.  Cardiovascular:   Patient denies leg swelling and chest pains.  Respiratory:   Patient denies cough and shortness of breath.   Endocrine:   Patient denies excessive thirst.  Musculoskeletal:   Patient denies back pain and joint pain.  Neurological:   Patient denies headaches and dizziness.  Psychologic:   Patient denies depression and anxiety.   VITAL SIGNS:      05/30/2019 03:20 PM  Weight 165 lb / 74.84 kg  Height 69 in / 175.26 cm  BP 134/87 mmHg  Pulse 71 /min  Temperature 97.1 F / 36.1 C  BMI 24.4 kg/m   MULTI-SYSTEM PHYSICAL EXAMINATION:    Constitutional: Well-nourished. No physical deformities. Normally developed. Good grooming.  Neck: Neck symmetrical, not swollen. Normal tracheal position.  Respiratory: Normal breath sounds. No labored breathing, no use of accessory muscles.   Cardiovascular: Regular rate and rhythm. No murmur, no gallop.   Lymphatic: No enlargement of neck, axillae, groin.  Skin: No paleness, no jaundice, no cyanosis. No lesion, no ulcer, no rash.  Neurologic / Psychiatric: Oriented to time, oriented to place, oriented to person. No depression, no anxiety, no agitation.  Gastrointestinal: No mass, no tenderness, no rigidity, non obese abdomen.  Eyes: Normal conjunctivae. Normal eyelids.  Ears, Nose, Mouth, and Throat: Left ear no scars, no lesions, no masses. Right ear no scars, no lesions, no masses. Nose no scars, no lesions, no masses. Normal hearing. Normal lips.  Musculoskeletal: Normal gait and station of head and neck.     PAST DATA REVIEWED:  Source Of History:  Patient  Records Review:   Previous Patient Records  Urine Test Review:   Urinalysis   05/02/19  PSA  Total PSA 7.91 ng/mL    05/30/19  Urinalysis  Urine Appearance Clear   Urine Color Yellow   Urine Glucose Neg mg/dL  Urine Bilirubin Neg mg/dL  Urine Ketones Neg mg/dL  Urine Specific Gravity 1.015   Urine Blood Trace ery/uL  Urine pH 6.5   Urine Protein Neg mg/dL  Urine Urobilinogen 0.2 mg/dL  Urine Nitrites Neg   Urine Leukocyte Esterase Neg leu/uL  Urine WBC/hpf NS (Not Seen)   Urine RBC/hpf  3 - 10/hpf   Urine Epithelial Cells NS (Not Seen)   Urine Bacteria NS (Not Seen)   Urine Mucous Not Present   Urine Yeast NS (Not Seen)   Urine Trichomonas Not Present   Urine Cystals NS (Not Seen)   Urine Casts NS (Not Seen)   Urine Sperm Not Present    PROCEDURES:          Urinalysis w/Scope - 81001 Dipstick Dipstick Cont'd Micro  Color: Yellow Bilirubin: Neg mg/dL WBC/hpf: NS (Not Seen)  Appearance: Clear Ketones: Neg mg/dL RBC/hpf: 3 - 10/hpf  Specific Gravity: 1.015 Blood: Trace ery/uL Bacteria: NS (Not Seen)  pH: 6.5 Protein: Neg mg/dL Cystals: NS (Not Seen)  Glucose: Neg mg/dL Urobilinogen: 0.2 mg/dL Casts: NS (Not Seen)    Nitrites: Neg Trichomonas: Not Present    Leukocyte Esterase: Neg leu/uL Mucous: Not Present      Epithelial Cells: NS (Not Seen)      Yeast: NS (Not Seen)      Sperm: Not Present    ASSESSMENT:      ICD-10 Details  1 GU:   Prostate Cancer - C61    PLAN:            Medications Stop Meds:  Levaquin 750 mg tablet 1 tablet PO Morning of procedure  Start: 02/21/2019  Discontinue: 05/30/2019  - Reason: The medication cycle was completed.            Schedule Return Visit/Planned Activity: Keep Scheduled Appointment - Schedule Surgery          Document Letter(s):  Created for Patient: Clinical Summary         Notes:   There are no changes in the patients history or physical exam since last evaluation by Dr. Louis Meckel. Pt is scheduled to undergo RALP with BPLND on 06/09/19.   All pt's questions were answered to the best of my ability.

## 2019-06-10 DIAGNOSIS — Z7982 Long term (current) use of aspirin: Secondary | ICD-10-CM | POA: Diagnosis not present

## 2019-06-10 DIAGNOSIS — N529 Male erectile dysfunction, unspecified: Secondary | ICD-10-CM | POA: Diagnosis present

## 2019-06-10 DIAGNOSIS — Z79899 Other long term (current) drug therapy: Secondary | ICD-10-CM | POA: Diagnosis not present

## 2019-06-10 DIAGNOSIS — E7800 Pure hypercholesterolemia, unspecified: Secondary | ICD-10-CM | POA: Diagnosis present

## 2019-06-10 DIAGNOSIS — C61 Malignant neoplasm of prostate: Secondary | ICD-10-CM | POA: Diagnosis present

## 2019-06-10 DIAGNOSIS — M1991 Primary osteoarthritis, unspecified site: Secondary | ICD-10-CM | POA: Diagnosis present

## 2019-06-10 DIAGNOSIS — Z8042 Family history of malignant neoplasm of prostate: Secondary | ICD-10-CM | POA: Diagnosis not present

## 2019-06-10 LAB — BASIC METABOLIC PANEL
Anion gap: 6 (ref 5–15)
BUN: 10 mg/dL (ref 8–23)
CO2: 23 mmol/L (ref 22–32)
Calcium: 8.1 mg/dL — ABNORMAL LOW (ref 8.9–10.3)
Chloride: 106 mmol/L (ref 98–111)
Creatinine, Ser: 0.78 mg/dL (ref 0.61–1.24)
GFR calc Af Amer: >60 mL/min (ref >60)
GFR calc non Af Amer: >60 mL/min (ref >60)
Glucose, Bld: 146 mg/dL — ABNORMAL HIGH (ref 70–99)
Potassium: 4 mmol/L (ref 3.5–5.1)
Sodium: 135 mmol/L (ref 135–145)

## 2019-06-10 LAB — HEMOGLOBIN AND HEMATOCRIT, BLOOD
HCT: 35.3 % — ABNORMAL LOW (ref 39.0–52.0)
Hemoglobin: 11.3 g/dL — ABNORMAL LOW (ref 13.0–17.0)

## 2019-06-10 MED ORDER — CHLORHEXIDINE GLUCONATE CLOTH 2 % EX PADS
6.0000 | MEDICATED_PAD | Freq: Every day | CUTANEOUS | Status: DC
  Administered 2019-06-10 – 2019-06-11 (×2): 6 via TOPICAL

## 2019-06-10 NOTE — Progress Notes (Signed)
This RN notified by the NT that the patient's BP was 88/46, HR 60. On-call provider, Dr. Tresa Moore was paged. No new orders received at this time. Will continue to monitor and follow the POC.

## 2019-06-10 NOTE — Progress Notes (Signed)
1 Day Post-Op   Subjective/Chief Complaint:  1- Prostate Cancer - s/p robotic prostatecotmy + node dissection and repair small enterotomy 06/08/18.  Today "Ryan Hampton" is stable. Some low BP ovenight without increased HR that appears physiologic. Hgb 11's. Copious UOP, minimal JP output. Walked some yesterday.     Objective: Vital signs in last 24 hours: Temp:  [97.5 F (36.4 C)-98.3 F (36.8 C)] 98.1 F (36.7 C) (01/16 0409) Pulse Rate:  [62-95] 64 (01/16 0409) Resp:  [9-18] 16 (01/16 0409) BP: (88-135)/(46-84) 88/46 (01/16 0409) SpO2:  [96 %-100 %] 96 % (01/16 0409) Last BM Date: 06/08/19  Intake/Output from previous day: 01/15 0701 - 01/16 0700 In: 5434.6 [P.O.:720; I.V.:3364.1; IV Piggyback:1350.5] Out: 2220 [Urine:1750; Drains:170; Blood:300] Intake/Output this shift: Total I/O In: 1138.5 [I.V.:800; IV Piggyback:338.6] Out: 1240 [Urine:1200; Drains:40]  General appearance: alert, cooperative and very pleasant Eyes: negative Nose: Nares normal. Septum midline. Mucosa normal. No drainage or sinus tenderness. Throat: lips, mucosa, and tongue normal; teeth and gums normal Neck: supple, symmetrical, trachea midline Back: symmetric, no curvature. ROM normal. No CVA tenderness. Resp: non-labored on minimal [place] O2 Cardio: Nl rate GI: soft, non-tender; bowel sounds normal; no masses,  no organomegaly Male genitalia: normal, foley in place with copious NON-foul urine.  Extremities: extremities normal, atraumatic, no cyanosis or edema Pulses: 2+ and symmetric Skin: Skin color, texture, turgor normal. No rashes or lesions Lymph nodes: Cervical, supraclavicular, and axillary nodes normal. Neurologic: Grossly normal Incision/Wound: recent port / extraction sites c/d/i. JP with non-foul serosanguinous fluid.   Lab Results:  Recent Labs    06/07/19 1039 06/07/19 1039 06/09/19 1233 06/10/19 0554  WBC 5.7  --   --   --   HGB 15.5   < > 12.8* 11.3*  HCT 48.7   < > 40.8 35.3*   PLT 261  --   --   --    < > = values in this interval not displayed.   BMET Recent Labs    06/07/19 1039  NA 137  K 4.1  CL 104  CO2 25  GLUCOSE 95  BUN 14  CREATININE 0.84  CALCIUM 9.1   PT/INR No results for input(s): LABPROT, INR in the last 72 hours. ABG No results for input(s): PHART, HCO3 in the last 72 hours.  Invalid input(s): PCO2, PO2  Studies/Results: No results found.  Anti-infectives: Anti-infectives (From admission, onward)   Start     Dose/Rate Route Frequency Ordered Stop   06/09/19 1930  ceFAZolin (ANCEF) IVPB 1 g/50 mL premix     1 g 100 mL/hr over 30 Minutes Intravenous Every 8 hours 06/09/19 1229     06/09/19 1048  ceFAZolin (ANCEF) 2-4 GM/100ML-% IVPB    Note to Pharmacy: Bridget Hartshorn   : cabinet override      06/09/19 1048 06/09/19 1553   06/09/19 0551  ceFAZolin (ANCEF) IVPB 2g/100 mL premix     2 g 200 mL/hr over 30 Minutes Intravenous 30 min pre-op 06/09/19 0551 06/09/19 1208   06/09/19 0000  sulfamethoxazole-trimethoprim (BACTRIM DS) 800-160 MG tablet     1 tablet Oral 2 times daily 06/09/19 1201        Assessment/Plan:  1- Prostate Cancer - doing well POD 1. Remain on clears today. Ambulate, IVF to 1/2. Keep current drains.  Remain in house until return of bowel function. Pt understands goals.   Alexis Frock 06/10/2019

## 2019-06-10 NOTE — Progress Notes (Signed)
Bedside report with off going RN. Pt stable

## 2019-06-11 MED ORDER — ACETAMINOPHEN 500 MG PO TABS
1000.0000 mg | ORAL_TABLET | Freq: Three times a day (TID) | ORAL | Status: DC
  Administered 2019-06-11 – 2019-06-12 (×3): 1000 mg via ORAL
  Filled 2019-06-11 (×3): qty 2

## 2019-06-11 NOTE — Progress Notes (Signed)
Leg bag teaching and foley care done. Will need reinforcement prior to D/c. Py had diet increased to full. Had some soft mushy stools today

## 2019-06-11 NOTE — Progress Notes (Signed)
2 Days Post-Op   Subjective/Chief Complaint:  1- Prostate Cancer - s/p robotic prostatecotmy + node dissection and repair small enterotomy 06/08/18.  Today "Ryan Hampton" is stable. No events overnight. Ambulatory and tolerating clears. Some small flatus overnight.    Objective: Vital signs in last 24 hours: Temp:  [98.1 F (36.7 C)-98.2 F (36.8 C)] 98.2 F (36.8 C) (01/17 0505) Pulse Rate:  [63-64] 64 (01/17 0505) Resp:  [16-18] 16 (01/17 0505) BP: (111-119)/(58-74) 111/69 (01/17 0505) SpO2:  [96 %-97 %] 96 % (01/17 0505) Last BM Date: 06/08/19  Intake/Output from previous day: 01/16 0701 - 01/17 0700 In: 2524.2 [P.O.:720; I.V.:1354.2; IV Piggyback:450] Out: 4630 [Urine:4550; Drains:30] Intake/Output this shift: No intake/output data recorded.  General appearance: alert, cooperative and very pleasant Eyes: negative Nose: Nares normal. Septum midline. Mucosa normal. No drainage or sinus tenderness. Throat: lips, mucosa, and tongue normal; teeth and gums normal Neck: supple, symmetrical, trachea midline Back: symmetric, no curvature. ROM normal. No CVA tenderness. Resp: non-labored on minimal [place] O2 Cardio: Nl rate GI: soft, non-tender; bowel sounds normal; no masses,  no organomegaly Male genitalia: normal, foley in place with copious NON-foul urine.  Extremities: extremities normal, atraumatic, no cyanosis or edema Pulses: 2+ and symmetric Skin: Skin color, texture, turgor normal. No rashes or lesions Lymph nodes: Cervical, supraclavicular, and axillary nodes normal. Neurologic: Grossly normal Incision/Wound: recent port / extraction sites c/d/i. JP with non-foul serosanguinous fluid.   Lab Results:  Recent Labs    06/09/19 1233 06/10/19 0554  HGB 12.8* 11.3*  HCT 40.8 35.3*   BMET Recent Labs    06/10/19 0554  NA 135  K 4.0  CL 106  CO2 23  GLUCOSE 146*  BUN 10  CREATININE 0.78  CALCIUM 8.1*   PT/INR No results for input(s): LABPROT, INR in the last 72  hours. ABG No results for input(s): PHART, HCO3 in the last 72 hours.  Invalid input(s): PCO2, PO2  Studies/Results: No results found.  Anti-infectives: Anti-infectives (From admission, onward)   Start     Dose/Rate Route Frequency Ordered Stop   06/09/19 1930  ceFAZolin (ANCEF) IVPB 1 g/50 mL premix     1 g 100 mL/hr over 30 Minutes Intravenous Every 8 hours 06/09/19 1229     06/09/19 1048  ceFAZolin (ANCEF) 2-4 GM/100ML-% IVPB    Note to Pharmacy: Bridget Hartshorn   : cabinet override      06/09/19 1048 06/09/19 1553   06/09/19 0551  ceFAZolin (ANCEF) IVPB 2g/100 mL premix     2 g 200 mL/hr over 30 Minutes Intravenous 30 min pre-op 06/09/19 0551 06/09/19 1208   06/09/19 0000  sulfamethoxazole-trimethoprim (BACTRIM DS) 800-160 MG tablet     1 tablet Oral 2 times daily 06/09/19 1201        Assessment/Plan:  1- Prostate Cancer - doing well POD 2. Adv to fulls today. Ambulate, SLV. Scheduled tylenol to minimize narcotic req. Keep current drains.  Remain in house until return of bowel function, likleky tomorrow. Pt understands goals.   Alexis Frock 06/11/2019

## 2019-06-12 MED ORDER — KETOROLAC TROMETHAMINE 30 MG/ML IJ SOLN
30.0000 mg | Freq: Once | INTRAMUSCULAR | Status: DC
  Filled 2019-06-12: qty 1

## 2019-06-12 NOTE — Discharge Summary (Signed)
Date of admission: 06/09/2019  Date of discharge: 06/12/2019  Admission diagnosis: prostate cancer  Discharge diagnosis: same  Secondary diagnoses:  Patient Active Problem List   Diagnosis Date Noted  . Prostate cancer ([place]) 06/09/2019    Procedures performed: Procedure(s): XI ROBOTIC ASSISTED LAPAROSCOPIC RADICAL PROSTATECTOMY PELVIC LYMPH NODE DISSECTION enterotomy repair  History and Physical: For full details, please see admission history and physical. Briefly, Ryan Hampton is a 64 y.o. year old patient with prostate cancer.   Hospital Course: Patient tolerated the procedure well.  He was then transferred to the floor after an uneventful PACU stay.  His hospital course was uncomplicated.  On POD#3 he had met discharge criteria: was eating a regular diet, was up and ambulating independently,  pain was well controlled, had moved his bowels several times, and was ready to for discharge.   PE on day of discharge: NAD Vitals:   06/11/19 0505 06/11/19 1355 06/11/19 2214 06/12/19 0548  BP: 111/69 111/74 125/70 115/74  Pulse: 64 60 61 60  Resp: _0 Temp: 98.2 F (36.8 C) 98.6 F (37 C) 98.4 F (36.9 C) 98.1 F (36.7 C)  TempSrc: Oral Oral Oral Oral  SpO2: 96% 98% 97% 98%  Weight:      Height:        Intake/Output Summary (Last 24 hours) at 06/12/2019 0635 Last data filed at 06/11/2019 2200 Gross per 24 hour  Intake 0 ml  Output --  Net 0 ml  Non-labored breathing Abdomen is soft Extremities symmetric Foley draining clear urine  Laboratory values:  Recent Labs    06/09/19 1233 06/10/19 0554  HGB 12.8* 11.3*  HCT 40.8 35.3*   Recent Labs    06/10/19 0554  NA 135  K 4.0  CL 106  CO2 23  GLUCOSE 146*  BUN 10  CREATININE 0.78  CALCIUM 8.1*   No results for input(s): LABPT, INR in the last 72 hours. No results for input(s): LABURIN in the last 72 hours. Results for orders placed or performed during the hospital encounter of 06/07/19  Urine  culture     Status: None   Collection Time: 06/07/19 10:39 AM   Specimen: Urine, Clean Catch  Result Value Ref Range Status   Specimen Description   Final    URINE, CLEAN CATCH Performed at [facility], [place] [address], [place], [place] [address]    Special Requests   Final    NONE Performed at [facility], [place] [address], [place], [place] [address]    Culture   Final    NO GROWTH Performed at [facility], [place] [address] [place], [place] [address]    Report Status 06/08/2019 FINAL  Final    Disposition: Home  Discharge instruction: The patient was instructed to be ambulatory but told to refrain from heavy lifting, strenuous activity, or driving.   Discharge medications:  Allergies as of 06/12/2019   No Known Allergies     Medication List    STOP taking these medications   aspirin EC 81 MG tablet   multivitamin with minerals Tabs tablet   vitamin C 1000 MG tablet     TAKE these medications   ALPRAZolam 1 MG tablet Commonly known as: XANAX Take 1 mg by mouth at bedtime as needed for sleep.   atorvastatin 20 MG tablet Commonly known as: LIPITOR Take 20 mg by mouth at bedtime.   finasteride 1 MG tablet Commonly known as: PROPECIA  Take 1 mg by mouth at bedtime.   sulfamethoxazole-trimethoprim 800-160 MG tablet Commonly known as: BACTRIM DS Take 1 tablet by mouth 2 (two) times daily. Start the day prior to foley removal appointment   traMADol 50 MG tablet Commonly known as: Ultram Take 1-2 tablets (50-100 mg total) by mouth every 6 (six) hours as needed for moderate pain or severe pain.   valACYclovir 1000 MG tablet Commonly known as: VALTREX Take 1 g by mouth 2 (two) times daily as needed (fever blister).       Followup:  Follow-up Information    Karen Kays, NP On 06/16/2019.   Specialty: Nurse Practitioner Why: at 10:00 Contact information: [address] [place]  [address] [phone]

## 2019-06-14 LAB — SURGICAL PATHOLOGY

## 2020-10-01 DIAGNOSIS — E782 Mixed hyperlipidemia: Secondary | ICD-10-CM | POA: Diagnosis not present

## 2020-10-01 DIAGNOSIS — F325 Major depressive disorder, single episode, in full remission: Secondary | ICD-10-CM | POA: Diagnosis not present

## 2020-10-01 DIAGNOSIS — Z23 Encounter for immunization: Secondary | ICD-10-CM | POA: Diagnosis not present

## 2020-10-01 DIAGNOSIS — F419 Anxiety disorder, unspecified: Secondary | ICD-10-CM | POA: Diagnosis not present

## 2020-10-01 DIAGNOSIS — Z0000 Encounter for general adult medical examination without abnormal findings: Secondary | ICD-10-CM | POA: Diagnosis not present

## 2020-10-01 DIAGNOSIS — G4700 Insomnia, unspecified: Secondary | ICD-10-CM | POA: Diagnosis not present

## 2020-10-01 DIAGNOSIS — Z79899 Other long term (current) drug therapy: Secondary | ICD-10-CM | POA: Diagnosis not present

## 2020-10-01 DIAGNOSIS — E039 Hypothyroidism, unspecified: Secondary | ICD-10-CM | POA: Diagnosis not present

## 2020-10-15 ENCOUNTER — Other Ambulatory Visit: Payer: Self-pay | Admitting: Family Medicine

## 2020-10-15 DIAGNOSIS — Z136 Encounter for screening for cardiovascular disorders: Secondary | ICD-10-CM

## 2020-10-18 DIAGNOSIS — M9903 Segmental and somatic dysfunction of lumbar region: Secondary | ICD-10-CM | POA: Diagnosis not present

## 2020-10-18 DIAGNOSIS — M9901 Segmental and somatic dysfunction of cervical region: Secondary | ICD-10-CM | POA: Diagnosis not present

## 2020-10-18 DIAGNOSIS — M9902 Segmental and somatic dysfunction of thoracic region: Secondary | ICD-10-CM | POA: Diagnosis not present

## 2020-11-04 ENCOUNTER — Ambulatory Visit
Admission: RE | Admit: 2020-11-04 | Discharge: 2020-11-04 | Disposition: A | Discharge status: Home / Self Care | Payer: PPO | Source: Ambulatory Visit | Attending: Family Medicine | Admitting: Family Medicine

## 2020-11-04 DIAGNOSIS — Z136 Encounter for screening for cardiovascular disorders: Secondary | ICD-10-CM

## 2020-11-04 DIAGNOSIS — Z87891 Personal history of nicotine dependence: Secondary | ICD-10-CM | POA: Diagnosis not present

## 2020-12-12 DIAGNOSIS — M9902 Segmental and somatic dysfunction of thoracic region: Secondary | ICD-10-CM | POA: Diagnosis not present

## 2020-12-12 DIAGNOSIS — M9901 Segmental and somatic dysfunction of cervical region: Secondary | ICD-10-CM | POA: Diagnosis not present

## 2020-12-12 DIAGNOSIS — M9903 Segmental and somatic dysfunction of lumbar region: Secondary | ICD-10-CM | POA: Diagnosis not present

## 2021-02-05 DIAGNOSIS — Z8546 Personal history of malignant neoplasm of prostate: Secondary | ICD-10-CM | POA: Diagnosis not present

## 2021-02-11 DIAGNOSIS — N393 Stress incontinence (female) (male): Secondary | ICD-10-CM | POA: Diagnosis not present

## 2021-02-11 DIAGNOSIS — Z8546 Personal history of malignant neoplasm of prostate: Secondary | ICD-10-CM | POA: Diagnosis not present

## 2021-02-11 DIAGNOSIS — N5231 Erectile dysfunction following radical prostatectomy: Secondary | ICD-10-CM | POA: Diagnosis not present

## 2021-03-18 DIAGNOSIS — I77810 Thoracic aortic ectasia: Secondary | ICD-10-CM | POA: Diagnosis not present

## 2021-03-18 DIAGNOSIS — F325 Major depressive disorder, single episode, in full remission: Secondary | ICD-10-CM | POA: Diagnosis not present

## 2021-03-18 DIAGNOSIS — E782 Mixed hyperlipidemia: Secondary | ICD-10-CM | POA: Diagnosis not present

## 2021-03-18 DIAGNOSIS — F419 Anxiety disorder, unspecified: Secondary | ICD-10-CM | POA: Diagnosis not present

## 2021-08-04 DIAGNOSIS — Z8546 Personal history of malignant neoplasm of prostate: Secondary | ICD-10-CM | POA: Diagnosis not present

## 2021-08-11 DIAGNOSIS — N5231 Erectile dysfunction following radical prostatectomy: Secondary | ICD-10-CM | POA: Diagnosis not present

## 2021-08-11 DIAGNOSIS — N393 Stress incontinence (female) (male): Secondary | ICD-10-CM | POA: Diagnosis not present

## 2021-10-13 DIAGNOSIS — E782 Mixed hyperlipidemia: Secondary | ICD-10-CM | POA: Diagnosis not present

## 2021-10-13 DIAGNOSIS — Z79899 Other long term (current) drug therapy: Secondary | ICD-10-CM | POA: Diagnosis not present

## 2021-10-13 DIAGNOSIS — E039 Hypothyroidism, unspecified: Secondary | ICD-10-CM | POA: Diagnosis not present

## 2021-10-15 DIAGNOSIS — E782 Mixed hyperlipidemia: Secondary | ICD-10-CM | POA: Diagnosis not present

## 2021-10-15 DIAGNOSIS — F325 Major depressive disorder, single episode, in full remission: Secondary | ICD-10-CM | POA: Diagnosis not present

## 2021-10-15 DIAGNOSIS — Z0000 Encounter for general adult medical examination without abnormal findings: Secondary | ICD-10-CM | POA: Diagnosis not present

## 2021-10-15 DIAGNOSIS — E039 Hypothyroidism, unspecified: Secondary | ICD-10-CM | POA: Diagnosis not present

## 2021-10-15 DIAGNOSIS — I77810 Thoracic aortic ectasia: Secondary | ICD-10-CM | POA: Diagnosis not present

## 2021-10-15 DIAGNOSIS — C61 Malignant neoplasm of prostate: Secondary | ICD-10-CM | POA: Diagnosis not present

## 2021-10-15 DIAGNOSIS — L649 Androgenic alopecia, unspecified: Secondary | ICD-10-CM | POA: Diagnosis not present

## 2021-10-15 DIAGNOSIS — Z79899 Other long term (current) drug therapy: Secondary | ICD-10-CM | POA: Diagnosis not present

## 2022-01-21 DIAGNOSIS — L719 Rosacea, unspecified: Secondary | ICD-10-CM | POA: Diagnosis not present

## 2022-01-21 DIAGNOSIS — L821 Other seborrheic keratosis: Secondary | ICD-10-CM | POA: Diagnosis not present

## 2022-01-21 DIAGNOSIS — D2262 Melanocytic nevi of left upper limb, including shoulder: Secondary | ICD-10-CM | POA: Diagnosis not present

## 2022-01-21 DIAGNOSIS — D2261 Melanocytic nevi of right upper limb, including shoulder: Secondary | ICD-10-CM | POA: Diagnosis not present

## 2022-01-21 DIAGNOSIS — D2272 Melanocytic nevi of left lower limb, including hip: Secondary | ICD-10-CM | POA: Diagnosis not present

## 2022-01-21 DIAGNOSIS — D225 Melanocytic nevi of trunk: Secondary | ICD-10-CM | POA: Diagnosis not present

## 2022-01-21 DIAGNOSIS — L578 Other skin changes due to chronic exposure to nonionizing radiation: Secondary | ICD-10-CM | POA: Diagnosis not present

## 2022-01-21 DIAGNOSIS — D2271 Melanocytic nevi of right lower limb, including hip: Secondary | ICD-10-CM | POA: Diagnosis not present

## 2022-01-21 DIAGNOSIS — L570 Actinic keratosis: Secondary | ICD-10-CM | POA: Diagnosis not present

## 2022-01-21 DIAGNOSIS — Z85828 Personal history of other malignant neoplasm of skin: Secondary | ICD-10-CM | POA: Diagnosis not present

## 2022-01-21 DIAGNOSIS — L814 Other melanin hyperpigmentation: Secondary | ICD-10-CM | POA: Diagnosis not present

## 2022-01-21 DIAGNOSIS — L820 Inflamed seborrheic keratosis: Secondary | ICD-10-CM | POA: Diagnosis not present

## 2022-02-02 DIAGNOSIS — Z8546 Personal history of malignant neoplasm of prostate: Secondary | ICD-10-CM | POA: Diagnosis not present

## 2022-02-09 DIAGNOSIS — N5231 Erectile dysfunction following radical prostatectomy: Secondary | ICD-10-CM | POA: Diagnosis not present

## 2022-02-09 DIAGNOSIS — N393 Stress incontinence (female) (male): Secondary | ICD-10-CM | POA: Diagnosis not present

## 2022-09-10 DIAGNOSIS — Z8546 Personal history of malignant neoplasm of prostate: Secondary | ICD-10-CM | POA: Diagnosis not present

## 2022-09-16 DIAGNOSIS — N5231 Erectile dysfunction following radical prostatectomy: Secondary | ICD-10-CM | POA: Diagnosis not present

## 2022-09-16 DIAGNOSIS — Z8546 Personal history of malignant neoplasm of prostate: Secondary | ICD-10-CM | POA: Diagnosis not present

## 2022-09-16 DIAGNOSIS — N393 Stress incontinence (female) (male): Secondary | ICD-10-CM | POA: Diagnosis not present

## 2022-10-29 DIAGNOSIS — N393 Stress incontinence (female) (male): Secondary | ICD-10-CM | POA: Diagnosis not present

## 2022-11-05 ENCOUNTER — Encounter (HOSPITAL_COMMUNITY): Payer: Self-pay

## 2022-11-05 ENCOUNTER — Other Ambulatory Visit: Payer: Self-pay | Admitting: Urology

## 2022-11-10 DIAGNOSIS — E039 Hypothyroidism, unspecified: Secondary | ICD-10-CM | POA: Diagnosis not present

## 2022-11-10 DIAGNOSIS — F325 Major depressive disorder, single episode, in full remission: Secondary | ICD-10-CM | POA: Diagnosis not present

## 2022-11-10 DIAGNOSIS — L649 Androgenic alopecia, unspecified: Secondary | ICD-10-CM | POA: Diagnosis not present

## 2022-11-10 DIAGNOSIS — Z0000 Encounter for general adult medical examination without abnormal findings: Secondary | ICD-10-CM | POA: Diagnosis not present

## 2022-11-10 DIAGNOSIS — E782 Mixed hyperlipidemia: Secondary | ICD-10-CM | POA: Diagnosis not present

## 2022-11-10 DIAGNOSIS — R42 Dizziness and giddiness: Secondary | ICD-10-CM | POA: Diagnosis not present

## 2022-11-10 DIAGNOSIS — F419 Anxiety disorder, unspecified: Secondary | ICD-10-CM | POA: Diagnosis not present

## 2022-11-10 DIAGNOSIS — R600 Localized edema: Secondary | ICD-10-CM | POA: Diagnosis not present

## 2022-11-10 DIAGNOSIS — I77810 Thoracic aortic ectasia: Secondary | ICD-10-CM | POA: Diagnosis not present

## 2022-11-10 DIAGNOSIS — Z79899 Other long term (current) drug therapy: Secondary | ICD-10-CM | POA: Diagnosis not present

## 2022-11-19 DIAGNOSIS — N393 Stress incontinence (female) (male): Secondary | ICD-10-CM | POA: Diagnosis not present

## 2022-12-01 NOTE — Patient Instructions (Signed)
SURGICAL WAITING ROOM VISITATION  Patients having surgery or a procedure may have no more than 2 support people in the waiting area - these visitors may rotate.    Children under the age of 35 must have an adult with them who is not the patient.  Due to an increase in RSV and influenza rates and associated hospitalizations, children ages 25 and under may not visit patients in [facility] ([facility]) hospitals.  If the patient needs to stay at the hospital during part of their recovery, the visitor guidelines for inpatient rooms apply. Pre-op nurse will coordinate an appropriate time for 1 support person to accompany patient in pre-op.  This support person may not rotate.    Please refer to the [facility] website for the visitor guidelines for Inpatients (after your surgery is over and you are in a regular room).    Your procedure is scheduled on: 12/15/22   Report to [facility] Main Entrance    Report to admitting at 9:45 AM   Call this number if you have problems the morning of surgery [phone]   Do not eat food or drink liquids :After Midnight.          If you have questions, please contact your surgeon's office.   FOLLOW BOWEL PREP AND ANY ADDITIONAL PRE OP INSTRUCTIONS YOU RECEIVED FROM YOUR SURGEON'S OFFICE!!!     Oral Hygiene is also important to reduce your risk of infection.                                    Remember - BRUSH YOUR TEETH THE MORNING OF SURGERY WITH YOUR REGULAR TOOTHPASTE  DENTURES WILL BE REMOVED PRIOR TO SURGERY PLEASE DO NOT APPLY "Poly grip" OR ADHESIVES!!!   Take these medicines the morning of surgery with A SIP OF WATER: None                               You may not have any metal on your body including jewelry, and body piercing             Do not wear lotions, powders, cologne, or deodorant              Men may shave face and neck.   Do not bring valuables to the hospital. [facility] IS NOT             RESPONSIBLE   FOR  VALUABLES.   Contacts, glasses, dentures or bridgework may not be worn into surgery.  DO NOT BRING YOUR HOME MEDICATIONS TO THE HOSPITAL. PHARMACY WILL DISPENSE MEDICATIONS LISTED ON YOUR MEDICATION LIST TO YOU DURING YOUR ADMISSION IN THE HOSPITAL!    Patients discharged on the day of surgery will not be allowed to drive home.  Someone NEEDS to stay with you for the first 24 hours after anesthesia.   Special Instructions: Bring a copy of your healthcare power of attorney and living will documents the day of surgery if you haven't scanned them before.              Please read over the following fact sheets you were given: IF YOU HAVE QUESTIONS ABOUT YOUR PRE-OP INSTRUCTIONS PLEASE CALL [name]   If you received a COVID test during your pre-op visit  it is requested that you wear a mask when out in public, stay  away from anyone that may not be feeling well and notify your surgeon if you develop symptoms. If you test positive for Covid or have been in contact with anyone that has tested positive in the last 10 days please notify you surgeon.    [place] - Preparing for Surgery Before surgery, you can play an important role.  Because skin is not sterile, your skin needs to be as free of germs as possible.  You can reduce the number of germs on your skin by washing with CHG (chlorahexidine gluconate) soap before surgery.  CHG is an antiseptic cleaner which kills germs and bonds with the skin to continue killing germs even after washing. Please DO NOT use if you have an allergy to CHG or antibacterial soaps.  If your skin becomes reddened/irritated stop using the CHG and inform your nurse when you arrive at Short Stay. Do not shave (including legs and underarms) for at least 48 hours prior to the first CHG shower.  You may shave your face/neck.  Please follow these instructions carefully:  1.  Shower with CHG Soap the night before surgery and the  morning of surgery.  2.  If you  choose to wash your hair, wash your hair first as usual with your normal  shampoo.  3.  After you shampoo, rinse your hair and body thoroughly to remove the shampoo.                             4.  Use CHG as you would any other liquid soap.  You can apply chg directly to the skin and wash.  Gently with a scrungie or clean washcloth.  5.  Apply the CHG Soap to your body ONLY FROM THE NECK DOWN.   Do   not use on face/ open                           Wound or open sores. Avoid contact with eyes, ears mouth and   genitals (private parts).                       Wash face,  Genitals (private parts) with your normal soap.             6.  Wash thoroughly, paying special attention to the area where your    surgery  will be performed.  7.  Thoroughly rinse your body with warm water from the neck down.  8.  DO NOT shower/wash with your normal soap after using and rinsing off the CHG Soap.                9.  Pat yourself dry with a clean towel.            10.  Wear clean pajamas.            11.  Place clean sheets on your bed the night of your first shower and do not  sleep with pets. Day of Surgery : Do not apply any lotions/deodorants the morning of surgery.  Please wear clean clothes to the hospital/surgery center.  FAILURE TO FOLLOW THESE INSTRUCTIONS MAY RESULT IN THE CANCELLATION OF YOUR SURGERY  PATIENT SIGNATURE_________________________________  NURSE SIGNATURE__________________________________  ________________________________________________________________________

## 2022-12-01 NOTE — Progress Notes (Signed)
COVID Vaccine Completed:  Date of COVID positive in last 90 days:  PCP - Mila Palmer, MD Cardiologist -   Chest x-ray -  EKG -  Stress Test -  ECHO -  Cardiac Cath -  Pacemaker/ICD device last checked: Spinal Cord Stimulator:  Bowel Prep -   Sleep Study -  CPAP -   Fasting Blood Sugar -  Checks Blood Sugar _____ times a day  Last dose of GLP1 agonist-  N/A GLP1 instructions:  N/A   Last dose of SGLT-2 inhibitors-  N/A SGLT-2 instructions: N/A   Blood Thinner Instructions:  Time Aspirin Instructions: ASA 81 Last Dose:  Activity level:  Can go up a flight of stairs and perform activities of daily living without stopping and without symptoms of chest pain or shortness of breath.  Able to exercise without symptoms  Unable to go up a flight of stairs without symptoms of     Anesthesia review:   Patient denies shortness of breath, fever, cough and chest pain at PAT appointment  Patient verbalized understanding of instructions that were given to them at the PAT appointment. Patient was also instructed that they will need to review over the PAT instructions again at home before surgery.

## 2022-12-02 ENCOUNTER — Encounter (HOSPITAL_COMMUNITY): Payer: Self-pay

## 2022-12-02 ENCOUNTER — Encounter (HOSPITAL_COMMUNITY)
Admission: RE | Admit: 2022-12-02 | Discharge: 2022-12-02 | Disposition: A | Discharge status: Home / Self Care | Payer: PPO | Source: Ambulatory Visit | Attending: Urology | Admitting: Urology

## 2022-12-02 ENCOUNTER — Other Ambulatory Visit: Payer: Self-pay

## 2022-12-02 VITALS — BP 128/92 | HR 70 | Temp 98.3°F | Resp 14 | Ht 69.0 in | Wt 169.0 lb

## 2022-12-02 DIAGNOSIS — I77811 Abdominal aortic ectasia: Secondary | ICD-10-CM | POA: Diagnosis not present

## 2022-12-02 DIAGNOSIS — Z8546 Personal history of malignant neoplasm of prostate: Secondary | ICD-10-CM | POA: Insufficient documentation

## 2022-12-02 DIAGNOSIS — E785 Hyperlipidemia, unspecified: Secondary | ICD-10-CM | POA: Diagnosis not present

## 2022-12-02 DIAGNOSIS — Z01818 Encounter for other preprocedural examination: Secondary | ICD-10-CM

## 2022-12-02 DIAGNOSIS — F419 Anxiety disorder, unspecified: Secondary | ICD-10-CM | POA: Diagnosis not present

## 2022-12-02 DIAGNOSIS — N393 Stress incontinence (female) (male): Secondary | ICD-10-CM | POA: Insufficient documentation

## 2022-12-02 DIAGNOSIS — Z01812 Encounter for preprocedural laboratory examination: Secondary | ICD-10-CM | POA: Diagnosis not present

## 2022-12-02 HISTORY — DX: Dissection of thoracic aorta, unspecified: I71.019

## 2022-12-02 LAB — CBC
HCT: 49.3 % (ref 39.0–52.0)
Hemoglobin: 16.1 g/dL (ref 13.0–17.0)
MCH: 31 pg (ref 26.0–34.0)
MCHC: 32.7 g/dL (ref 30.0–36.0)
MCV: 94.8 fL (ref 80.0–100.0)
Platelets: 273 10*3/uL (ref 150–400)
RBC: 5.2 MIL/uL (ref 4.22–5.81)
RDW: 12.9 % (ref 11.5–15.5)
WBC: 6.7 10*3/uL (ref 4.0–10.5)
nRBC: 0 % (ref 0.0–0.2)

## 2022-12-03 LAB — URINE CULTURE: Culture: NO GROWTH

## 2022-12-07 ENCOUNTER — Encounter (HOSPITAL_COMMUNITY): Payer: Self-pay

## 2022-12-07 NOTE — Progress Notes (Signed)
Case: [id number] Date/Time: 12/15/22 1145   Procedure: INSERTION OF MALE SLING - 90 MINUTES NEEDED FOR CASE   Anesthesia type: General   Pre-op diagnosis: STRESS URINARY INCONTINENCE   Location[facility] 03 / [facility]   Surgeons: Despina Arias, MD       DISCUSSION: Ryan Hampton is a 67 yo male who presents to PAT prior to surgery above. PMH significant for HLD, anxiety, hx of prostate cancer s/p prostatectomy (in remission), dilated abdominal aorta.  Patient is followed by PCP for chronic medical problems. Last seen 11/10/22. All medical issues stable. 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 Last imaged in 2022 and repeat is due May 2027.  VS: BP (!) 128/92   Pulse 70   Temp 36.8 C (Oral)   Resp 14   Ht 5\' 9"  (1.753 m)   Wt 76.7 kg   SpO2 96%   BMI 24.96 kg/m   PROVIDERS: Mila Palmer, MD   LABS: Labs reviewed: Acceptable for surgery. (all labs ordered are listed, but only abnormal results are displayed)  Labs Reviewed  URINE CULTURE  CBC     IMAGES:  US Aorta 11/04/2020:   IMPRESSION: Dilatation of the distal aorta to 2.9 cm. Recommend follow-up ultrasound every 5 years. This recommendation follows ACR consensus guidelines: White Paper of the ACR Incidental Findings Committee II on Vascular Findings. J Am Coll Radiol 2013; 10:789-794.   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   Dissecting aortic aneurysm (any part), thoracic [facility]     Past Surgical History:  Procedure Laterality Date   PELVIC LYMPH NODE DISSECTION Bilateral 06/09/2019   Procedure: PELVIC LYMPH NODE DISSECTION;  Surgeon: Crist Fat, MD;  Location[facility];  Service: Urology;  Laterality: Bilateral;   ROBOT ASSISTED LAPAROSCOPIC RADICAL PROSTATECTOMY N/A 06/09/2019   Procedure: XI ROBOTIC ASSISTED LAPAROSCOPIC RADICAL PROSTATECTOMY;  Surgeon: Crist Fat,  MD;  Location[facility];  Service: Urology;  Laterality: N/A;   SHOULDER SURGERY     Left Pinned together   skin cancer removal      SMALL BOWEL REPAIR N/A 06/09/2019   Procedure: enterotomy repair;  Surgeon: Crist Fat, MD;  Location[facility];  Service: Urology;  Laterality: N/A;    MEDICATIONS:  ALPRAZolam (XANAX) 1 MG tablet   Ascorbic Acid (VITAMIN C) 1000 MG tablet   aspirin EC 81 MG tablet   atorvastatin (LIPITOR) 20 MG tablet   finasteride (PROPECIA) 1 MG tablet   TURMERIC CURCUMIN PO   valACYclovir (VALTREX) 1000 MG tablet   No current facility-administered medications for this encounter.   Marcille Blanco [facility] Short Stay/Anesthesiology [facility] Phone [phone] 12/07/2022 2:20 PM

## 2022-12-07 NOTE — Anesthesia Preprocedure Evaluation (Addendum)
Anesthesia Evaluation  Patient identified by MRN, date of birth, ID band Patient awake    Reviewed: Allergy & Precautions, NPO status , Patient's Chart, lab work & pertinent test results  History of Anesthesia Complications Negative for: history of anesthetic complications  Airway Mallampati: II  TM Distance: >3 FB Neck ROM: Full    Dental  (+) Teeth Intact, Dental Advisory Given   Pulmonary neg pulmonary ROS   breath sounds clear to auscultation       Cardiovascular (-) angina negative cardio ROS  Rhythm:Regular Rate:Normal     Neuro/Psych negative neurological ROS     GI/Hepatic negative GI ROS, Neg liver ROS,,,  Endo/Other  negative endocrine ROS    Renal/GU negative Renal ROS     Musculoskeletal   Abdominal   Peds  Hematology negative hematology ROS (+)   Anesthesia Other Findings H/o prostate cancer  Reproductive/Obstetrics                             Anesthesia Physical Anesthesia Plan  ASA: 2  Anesthesia Plan: General   Post-op Pain Management: Tylenol PO (pre-op)*   Induction: Intravenous  PONV Risk Score and Plan: 2 and Ondansetron and Dexamethasone  Airway Management Planned: LMA  Additional Equipment: None  Intra-op Plan:   Post-operative Plan:   Informed Consent: I have reviewed the patients History and Physical, chart, labs and discussed the procedure including the risks, benefits and alternatives for the proposed anesthesia with the patient or authorized representative who has indicated his/her understanding and acceptance.     Dental advisory given  Plan Discussed with: CRNA and Surgeon  Anesthesia Plan Comments: (See PAT note from 7/10 by K Gekas PA-C )        Anesthesia Quick Evaluation

## 2022-12-15 ENCOUNTER — Ambulatory Visit (HOSPITAL_COMMUNITY): Payer: Self-pay | Admitting: Physician Assistant

## 2022-12-15 ENCOUNTER — Ambulatory Visit (HOSPITAL_COMMUNITY)
Admission: RE | Admit: 2022-12-15 | Discharge: 2022-12-15 | Disposition: A | Discharge status: Home / Self Care | Payer: PPO | Source: Ambulatory Visit | Attending: Urology | Admitting: Urology

## 2022-12-15 ENCOUNTER — Other Ambulatory Visit: Payer: Self-pay

## 2022-12-15 ENCOUNTER — Encounter (HOSPITAL_COMMUNITY)
Admission: RE | Disposition: A | Discharge status: Home / Self Care | Payer: Self-pay | Source: Ambulatory Visit | Attending: Urology

## 2022-12-15 ENCOUNTER — Ambulatory Visit (HOSPITAL_BASED_OUTPATIENT_CLINIC_OR_DEPARTMENT_OTHER): Payer: PPO | Admitting: Anesthesiology

## 2022-12-15 DIAGNOSIS — Z8546 Personal history of malignant neoplasm of prostate: Secondary | ICD-10-CM | POA: Diagnosis not present

## 2022-12-15 DIAGNOSIS — Z79899 Other long term (current) drug therapy: Secondary | ICD-10-CM | POA: Diagnosis not present

## 2022-12-15 DIAGNOSIS — N393 Stress incontinence (female) (male): Secondary | ICD-10-CM

## 2022-12-15 DIAGNOSIS — C61 Malignant neoplasm of prostate: Secondary | ICD-10-CM

## 2022-12-15 DIAGNOSIS — Z9079 Acquired absence of other genital organ(s): Secondary | ICD-10-CM | POA: Insufficient documentation

## 2022-12-15 HISTORY — PX: URETHRAL SLING: SHX2621

## 2022-12-15 LAB — BASIC METABOLIC PANEL
Anion gap: 6 (ref 5–15)
BUN: 14 mg/dL (ref 8–23)
CO2: 27 mmol/L (ref 22–32)
Calcium: 8.8 mg/dL — ABNORMAL LOW (ref 8.9–10.3)
Chloride: 105 mmol/L (ref 98–111)
Creatinine, Ser: 0.88 mg/dL (ref 0.61–1.24)
GFR, Estimated: >60 mL/min (ref >60)
Glucose, Bld: 105 mg/dL — ABNORMAL HIGH (ref 70–99)
Potassium: 4.4 mmol/L (ref 3.5–5.1)
Sodium: 138 mmol/L (ref 135–145)

## 2022-12-15 SURGERY — CREATION, URETHRAL SLING, MALE
Anesthesia: General

## 2022-12-15 MED ORDER — LACTATED RINGERS IV SOLN: INTRAVENOUS | Status: DC

## 2022-12-15 MED ORDER — OXYCODONE HCL 5 MG PO TABS
ORAL_TABLET | ORAL | Status: AC
  Filled 2022-12-15: qty 1

## 2022-12-15 MED ORDER — STERILE WATER FOR IRRIGATION IR SOLN
Status: DC | PRN
  Administered 2022-12-15: 1000 mL

## 2022-12-15 MED ORDER — PHENYLEPHRINE 80 MCG/ML (10ML) SYRINGE FOR IV PUSH (FOR BLOOD PRESSURE SUPPORT)
PREFILLED_SYRINGE | INTRAVENOUS | Status: DC | PRN
  Administered 2022-12-15: 80 ug via INTRAVENOUS

## 2022-12-15 MED ORDER — OXYCODONE HCL 5 MG PO TABS: 5.0000 mg | ORAL_TABLET | Freq: Four times a day (QID) | ORAL | 0 refills | Status: AC | PRN

## 2022-12-15 MED ORDER — HYDROMORPHONE HCL 1 MG/ML IJ SOLN: 0.2500 mg | INTRAMUSCULAR | Status: DC | PRN

## 2022-12-15 MED ORDER — FENTANYL CITRATE (PF) 100 MCG/2ML IJ SOLN
INTRAMUSCULAR | Status: DC | PRN
  Administered 2022-12-15: 50 ug via INTRAVENOUS
  Administered 2022-12-15 (×2): 25 ug via INTRAVENOUS

## 2022-12-15 MED ORDER — CELECOXIB 200 MG PO CAPS: 200.0000 mg | ORAL_CAPSULE | Freq: Two times a day (BID) | ORAL | 1 refills | Status: AC

## 2022-12-15 MED ORDER — DEXMEDETOMIDINE HCL IN NACL 200 MCG/50ML IV SOLN
INTRAVENOUS | Status: DC | PRN
  Administered 2022-12-15 (×2): 4 ug via INTRAVENOUS
  Administered 2022-12-15: 2 ug via INTRAVENOUS

## 2022-12-15 MED ORDER — MIDAZOLAM HCL 2 MG/2ML IJ SOLN
INTRAMUSCULAR | Status: AC
  Filled 2022-12-15: qty 2

## 2022-12-15 MED ORDER — DEXAMETHASONE SODIUM PHOSPHATE 10 MG/ML IJ SOLN
INTRAMUSCULAR | Status: AC
  Filled 2022-12-15: qty 1

## 2022-12-15 MED ORDER — LIDOCAINE HCL (PF) 1 % IJ SOLN
INTRAMUSCULAR | Status: DC | PRN
  Administered 2022-12-15: 6 mL

## 2022-12-15 MED ORDER — BUPIVACAINE HCL (PF) 0.5 % IJ SOLN
INTRAMUSCULAR | Status: DC | PRN
  Administered 2022-12-15: 6 mL

## 2022-12-15 MED ORDER — VANCOMYCIN HCL IN DEXTROSE 1-5 GM/200ML-% IV SOLN
1000.0000 mg | INTRAVENOUS | Status: AC
  Administered 2022-12-15: 1000 mg via INTRAVENOUS
  Filled 2022-12-15: qty 200

## 2022-12-15 MED ORDER — PROMETHAZINE HCL 25 MG/ML IJ SOLN: 6.2500 mg | INTRAMUSCULAR | Status: DC | PRN

## 2022-12-15 MED ORDER — BUPIVACAINE-EPINEPHRINE (PF) 0.5% -1:200000 IJ SOLN
INTRAMUSCULAR | Status: AC
  Filled 2022-12-15: qty 30

## 2022-12-15 MED ORDER — ORAL CARE MOUTH RINSE: 15.0000 mL | Freq: Once | OROMUCOSAL | Status: AC

## 2022-12-15 MED ORDER — MIDAZOLAM HCL 2 MG/2ML IJ SOLN: 0.5000 mg | Freq: Once | INTRAMUSCULAR | Status: DC | PRN

## 2022-12-15 MED ORDER — SODIUM CHLORIDE 0.9 % IR SOLN
Status: DC | PRN
  Administered 2022-12-15: 1000 mL via INTRAVESICAL

## 2022-12-15 MED ORDER — ONDANSETRON HCL 4 MG/2ML IJ SOLN
INTRAMUSCULAR | Status: AC
  Filled 2022-12-15: qty 2

## 2022-12-15 MED ORDER — SULFAMETHOXAZOLE-TRIMETHOPRIM 800-160 MG PO TABS: 1.0000 | ORAL_TABLET | Freq: Two times a day (BID) | ORAL | 0 refills | Status: AC

## 2022-12-15 MED ORDER — DEXAMETHASONE SODIUM PHOSPHATE 4 MG/ML IJ SOLN
INTRAMUSCULAR | Status: DC | PRN
  Administered 2022-12-15: 5 mg via INTRAVENOUS

## 2022-12-15 MED ORDER — PROPOFOL 10 MG/ML IV BOLUS
INTRAVENOUS | Status: AC
  Filled 2022-12-15: qty 20

## 2022-12-15 MED ORDER — LIDOCAINE HCL (CARDIAC) PF 100 MG/5ML IV SOSY
PREFILLED_SYRINGE | INTRAVENOUS | Status: DC | PRN
  Administered 2022-12-15: 40 mg via INTRAVENOUS

## 2022-12-15 MED ORDER — OXYCODONE HCL 5 MG/5ML PO SOLN: 5.0000 mg | Freq: Once | ORAL | Status: AC | PRN

## 2022-12-15 MED ORDER — CHLORHEXIDINE GLUCONATE 0.12 % MT SOLN
15.0000 mL | Freq: Once | OROMUCOSAL | Status: AC
  Administered 2022-12-15: 15 mL via OROMUCOSAL

## 2022-12-15 MED ORDER — PROPOFOL 10 MG/ML IV BOLUS
INTRAVENOUS | Status: DC | PRN
Start: 2022-12-15 — End: 2022-12-15
  Administered 2022-12-15: 50 mg via INTRAVENOUS
  Administered 2022-12-15: 150 mg via INTRAVENOUS

## 2022-12-15 MED ORDER — EPHEDRINE SULFATE (PRESSORS) 50 MG/ML IJ SOLN
INTRAMUSCULAR | Status: DC | PRN
  Administered 2022-12-15 (×2): 10 mg via INTRAVENOUS

## 2022-12-15 MED ORDER — CHLORHEXIDINE GLUCONATE 4 % EX SOLN: Freq: Once | CUTANEOUS | Status: DC

## 2022-12-15 MED ORDER — OXYCODONE HCL 5 MG PO TABS
5.0000 mg | ORAL_TABLET | Freq: Once | ORAL | Status: AC | PRN
  Administered 2022-12-15: 5 mg via ORAL

## 2022-12-15 MED ORDER — FENTANYL CITRATE (PF) 100 MCG/2ML IJ SOLN
INTRAMUSCULAR | Status: AC
  Filled 2022-12-15: qty 2

## 2022-12-15 MED ORDER — LIDOCAINE HCL 1 % IJ SOLN
INTRAMUSCULAR | Status: AC
  Filled 2022-12-15: qty 20

## 2022-12-15 MED ORDER — ACETAMINOPHEN 500 MG PO TABS
1000.0000 mg | ORAL_TABLET | Freq: Once | ORAL | Status: AC
  Administered 2022-12-15: 1000 mg via ORAL
  Filled 2022-12-15: qty 2

## 2022-12-15 MED ORDER — MEPERIDINE HCL 50 MG/ML IJ SOLN: 6.2500 mg | INTRAMUSCULAR | Status: DC | PRN

## 2022-12-15 MED ORDER — 0.9 % SODIUM CHLORIDE (POUR BTL) OPTIME
TOPICAL | Status: DC | PRN
  Administered 2022-12-15: 1000 mL

## 2022-12-15 MED ORDER — LIDOCAINE HCL (PF) 2 % IJ SOLN
INTRAMUSCULAR | Status: AC
  Filled 2022-12-15: qty 5

## 2022-12-15 MED ORDER — ONDANSETRON HCL 4 MG/2ML IJ SOLN
INTRAMUSCULAR | Status: DC | PRN
  Administered 2022-12-15: 4 mg via INTRAVENOUS

## 2022-12-15 MED ORDER — MIDAZOLAM HCL 5 MG/5ML IJ SOLN
INTRAMUSCULAR | Status: DC | PRN
  Administered 2022-12-15: 2 mg via INTRAVENOUS

## 2022-12-15 MED ORDER — GENTAMICIN SULFATE 40 MG/ML IJ SOLN
5.0000 mg/kg | INTRAVENOUS | Status: AC
  Administered 2022-12-15: 383.6 mg via INTRAVENOUS
  Filled 2022-12-15: qty 9.5

## 2022-12-15 SURGICAL SUPPLY — 56 items
ADH SKN CLS APL DERMABOND .7 (GAUZE/BANDAGES/DRESSINGS) ×1
APL PRP STRL LF DISP 70% ISPRP (MISCELLANEOUS) ×2
BAG COUNTER SPONGE SURGICOUNT (BAG) IMPLANT
BAG DRN RND TRDRP ANRFLXCHMBR (UROLOGICAL SUPPLIES) ×1
BAG SPNG CNTER NS LX DISP (BAG)
BAG URINE DRAIN 2000ML AR STRL (UROLOGICAL SUPPLIES) ×2 IMPLANT
BLADE SURG 15 STRL LF DISP TIS (BLADE) ×2 IMPLANT
BLADE SURG 15 STRL SS (BLADE) ×1
BNDG GAUZE DERMACEA FLUFF 4 (GAUZE/BANDAGES/DRESSINGS) ×2 IMPLANT
BNDG GZE DERMACEA 4 6PLY (GAUZE/BANDAGES/DRESSINGS) ×1
BRIEF MESH DISP LRG (UNDERPADS AND DIAPERS) ×2 IMPLANT
CATH FOLEY 2WAY SLVR 5CC 14FR (CATHETERS) ×2 IMPLANT
CATH TIEMANN FOLEY 18FR 5CC (CATHETERS) ×2 IMPLANT
CHLORAPREP W/TINT 26 (MISCELLANEOUS) ×4 IMPLANT
COVER BACK TABLE 60X90IN (DRAPES) ×2 IMPLANT
COVER MAYO STAND STRL (DRAPES) ×2 IMPLANT
COVER SURGICAL LIGHT HANDLE (MISCELLANEOUS) ×2 IMPLANT
DERMABOND ADVANCED .7 DNX12 (GAUZE/BANDAGES/DRESSINGS) ×4 IMPLANT
DRAPE UNDERBUTTOCKS STRL (DISPOSABLE) ×2 IMPLANT
DRSG TELFA 3X8 NADH STRL (GAUZE/BANDAGES/DRESSINGS) ×2 IMPLANT
ELECT PENCIL ROCKER SW 15FT (MISCELLANEOUS) ×2 IMPLANT
GAUZE 4X4 16PLY ~~LOC~~+RFID DBL (SPONGE) ×4 IMPLANT
GLOVE BIOGEL M 7.0 STRL (GLOVE) ×2 IMPLANT
GLOVE BIOGEL PI IND STRL 7.0 (GLOVE) ×2 IMPLANT
GOWN STRL REUS W/ TWL XL LVL3 (GOWN DISPOSABLE) ×2 IMPLANT
GOWN STRL REUS W/TWL XL LVL3 (GOWN DISPOSABLE) ×1
HOLDER FOLEY CATH W/STRAP (MISCELLANEOUS) ×2 IMPLANT
KIT BASIN OR (CUSTOM PROCEDURE TRAY) ×2 IMPLANT
KIT TURNOVER KIT A (KITS) IMPLANT
LUBRICANT JELLY K Y 4OZ (MISCELLANEOUS) ×2 IMPLANT
NDL HYPO 22X1.5 SAFETY MO (MISCELLANEOUS) ×2 IMPLANT
NDL SPNL 22GX3.5 QUINCKE BK (NEEDLE) ×2 IMPLANT
NEEDLE HYPO 22X1.5 SAFETY MO (MISCELLANEOUS) ×1 IMPLANT
NEEDLE SPNL 22GX3.5 QUINCKE BK (NEEDLE) ×1 IMPLANT
PLUG CATH AND CAP STRL 200 (CATHETERS) ×2 IMPLANT
PROTECTOR NERVE ULNAR (MISCELLANEOUS) ×2 IMPLANT
SET IRRIG Y TYPE TUR BLADDER L (SET/KITS/TRAYS/PACK) ×2 IMPLANT
SHEET LAVH (DRAPES) ×2 IMPLANT
SLING ADVANCE MALE SYSTEM (Mesh General) IMPLANT
SPIKE FLUID TRANSFER (MISCELLANEOUS) IMPLANT
STAPLER VISISTAT 35W (STAPLE) ×2 IMPLANT
SUT MNCRL AB 4-0 PS2 18 (SUTURE) ×2 IMPLANT
SUT SILK 2 0 30 PSL (SUTURE) IMPLANT
SUT VIC AB 2-0 SH 27 (SUTURE)
SUT VIC AB 2-0 SH 27X BRD (SUTURE) IMPLANT
SUT VIC AB 3-0 SH 27 (SUTURE) ×5
SUT VIC AB 3-0 SH 27XBRD (SUTURE) ×8 IMPLANT
SUT VIC AB 4-0 PS2 27 (SUTURE) IMPLANT
SUT VIC AB 4-0 RB1 27 (SUTURE)
SUT VIC AB 4-0 RB1 27XBRD (SUTURE) IMPLANT
SYR 10ML LL (SYRINGE) ×2 IMPLANT
SYR 20ML LL LF (SYRINGE) ×2 IMPLANT
SYR BULB IRRIG 60ML STRL (SYRINGE) ×2 IMPLANT
TOWEL OR 17X26 10 PK STRL BLUE (TOWEL DISPOSABLE) ×4 IMPLANT
TUBING CONNECTING 10 (TUBING) ×2 IMPLANT
YANKAUER SUCT BULB TIP 10FT TU (MISCELLANEOUS) ×2 IMPLANT

## 2022-12-15 NOTE — Discharge Instructions (Signed)
Male Sling Post Operative Instructions Dr. Luke Machen  You may notice some swelling or black and blue bruising. This is very common, and may increase slightly over the next several days. Typically it will begin to improve 1-2 weeks after surgery You may take a shower 48 hours after surgery. Avoid submerging yourself completely in water (bath tubs, hot tubs, swimming pools, etc) until 1 month after the surgery. To clean the incision, let soapy water gently wash over the area and pat lightly. Use supportive, tight-fitting underwear for the first two weeks after surgery. For example, jock straps, sliding shorts, or briefs Apply ice packs 20 minutes on/20 minutes off for at least the first 2 days following surgery. This will help  minimize swelling and discomfort. After the first few days you can continue using ice packs if they are helpful  The skin incision is closed with sutures and purple skin glue. Both of these things dissolve on their own over the course of several weeks.   Avoid lifting anything heavier than 15 pounds for 1 month Do not put any direct pressure on the incision for long periods of time for the first 6 weeks following surgery. For example, do not ride a bicycle, motorcycle, ATV, or horse.  Sitting on a soft cushion, "donut" cushion, or pillow can help with the discomfort Avoid all sexual contact for 2 weeks following the surgery. You will be prescribed an antibiotic following the surgery; please take this as prescribed.  For pain post-operatively, you will typically be prescribed 2 medicines. The first is an anti-inflammatory medication (Celebrex, Toradol, Meloxicam). The second is narcotic pain medication (Tramadol, Oxycodone). You can take these as needed, and can supplement with over the counter tylenol. I recommend limiting the amount of narcotic pain medication you take as these medications can cause constipation and in rare cases, addiction.   

## 2022-12-15 NOTE — Anesthesia Postprocedure Evaluation (Signed)
Anesthesia Post Note  Patient: Ryan Hampton  Procedure(s) Performed: INSERTION OF MALE SLING     Patient location during evaluation: PACU Anesthesia Type: General Level of consciousness: awake and alert, patient cooperative and oriented Pain management: pain level controlled Vital Signs Assessment: post-procedure vital signs reviewed and stable Respiratory status: spontaneous breathing, nonlabored ventilation and respiratory function stable Cardiovascular status: blood pressure returned to baseline and stable Postop Assessment: no apparent nausea or vomiting, able to ambulate and adequate PO intake Anesthetic complications: no   No notable events documented.  Last Vitals:  Vitals:   12/15/22 1545 12/15/22 1600  BP: (!) 165/85 131/74  Pulse: 68 62  Resp: 14 16  Temp:  36.8 C  SpO2: 99% 99%    Last Pain:  Vitals:   12/15/22 1600  TempSrc:   PainSc: 4                  Meygan Kyser,E. Rusell Meneely

## 2022-12-15 NOTE — H&P (Signed)
H&P  History of Present Illness: Ryan Hampton is a 67 y.o. 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    Past Surgical History:  Procedure Laterality Date   PELVIC LYMPH NODE DISSECTION Bilateral 06/09/2019   Procedure: PELVIC LYMPH NODE DISSECTION;  Surgeon: Crist Fat, MD;  [facility];  Service: Urology;  Laterality: Bilateral;   ROBOT ASSISTED LAPAROSCOPIC RADICAL PROSTATECTOMY N/A 06/09/2019   Procedure: XI ROBOTIC ASSISTED LAPAROSCOPIC RADICAL PROSTATECTOMY;  Surgeon: Crist Fat, MD;  [facility];  Service: Urology;  Laterality: N/A;   SHOULDER SURGERY     Left Pinned together   skin cancer removal      SMALL BOWEL REPAIR N/A 06/09/2019   Procedure: enterotomy repair;  Surgeon: Crist Fat, MD;  [facility];  Service: Urology;  Laterality: N/A;    Home Medications:  Current Meds  Medication Sig   ALPRAZolam (XANAX) 1 MG tablet Take 1 mg by mouth at bedtime as needed for sleep.   Ascorbic Acid (VITAMIN C) 1000 MG tablet Take 1,000 mg by mouth daily.   aspirin EC 81 MG tablet Take 81 mg by mouth daily. Swallow whole.   atorvastatin (LIPITOR) 20 MG tablet Take 20 mg by mouth at bedtime.   finasteride (PROPECIA) 1 MG tablet Take 1 mg by mouth at bedtime.    TURMERIC CURCUMIN PO Take 1,500 mg by mouth daily.   valACYclovir (VALTREX) 1000 MG tablet Take 1 g by mouth 2 (two) times daily as needed (fever blister).     Allergies: No Known Allergies  No family history on file.  Social History:  reports that he has never smoked. He has never used smokeless tobacco. He reports current alcohol use of about 6.0 standard drinks of alcohol per week. He reports that he does not use drugs.  ROS: A complete review of systems was performed.  All systems are negative except for pertinent findings as  noted.  Physical Exam:  Vital signs in last 24 hours: Temp:  [98.1 F (36.7 C)] 98.1 F (36.7 C) (07/23 0959) Pulse Rate:  [67] 67 (07/23 0959) Resp:  [16] 16 (07/23 0959) BP: (137)/(82) 137/82 (07/23 0959) SpO2:  [97 %] 97 % (07/23 0959) Constitutional:  Alert and oriented, No acute distress Cardiovascular: Regular rate and rhythm Respiratory: Normal respiratory effort, Lungs clear bilaterally GI: Abdomen is soft, nontender, nondistended, no abdominal masses Lymphatic: No lymphadenopathy Neurologic: Grossly intact, no focal deficits Psychiatric: Normal mood and affect   Laboratory Data:  No results for input(s): "WBC", "HGB", "HCT", "PLT" in the last 72 hours.  No results for input(s): "NA", "K", "CL", "GLUCOSE", "BUN", "CALCIUM", "CREATININE" in the last 72 hours.  Invalid input(s): "CO3"   No results found for this or any previous visit (from the past 24 hour(s)). No results found for this or any previous visit (from the past 240 hour(s)).  Renal Function: No results for input(s): "CREATININE" in the last 168 hours. CrCl cannot be calculated (Patient's most recent lab result is older than the maximum 21 days allowed.).  Radiologic Imaging: No results found.  Assessment:  Ryan Hampton is a 67 y.o. 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 Procedure and risks reviewed, including but not limited to bleeding, infection, implant infection,  implant malfunction, implant malplacement, failure to improve incontinence or make worse, erosion, damage to adjacent structures, pain, urinary retention. All questions answered   Irine Seal, MD 12/15/2022, 10:13 AM  [facility] Pager: [phone]

## 2022-12-15 NOTE — Op Note (Signed)
Preoperative diagnosis:  1. Stress urinary incontinence 2. History of prostate cancer s/p prostatectomy  Postoperative diagnosis: same  Procedure(s): 1. Insertion of male sling (advance XP) 2. Flexible cystoscopy  Surgeon: Dr. Irine Seal  Assistant: Jerald Kief MD  Anesthesia: General  Complications: none  EBL: 20 cc  Intraoperative findings: Good bulbar urethra displacement and urethral coaptation after placement and tightening of sling  Indication: Ryan Hampton is a 67 y.o.yo M who with stress incontinence s/p prostatectomy. After an extensive discussion, he is interested in proceeding with a sling  Description of procedure:  After appropriate consent had been obtained, the patient was brought to the operative suite where anesthesia was induced. The patient was prepped and draped in the usual sterile fashion. Extra care was taken with leg positioning to minimize the risk of compartment syndrome neuropathy and deep vein thrombosis.  Preoperative antibiotics were given.  I an made approximately 5 cm incision in the perineum involving the lower aspect of the scrotum.  I dissected down through soft tissue and mobilized the subcutaneous tissue from the bulbospongiosus midline. The lonestar retractor was placed and used throughout for exposure. I then split the muscle in the midline with my usual technique. Using a curved cerebellar retractor I exposed nicely the bulbar urethra.  It was easy to see and feel the perineal tendon that was sharply taken down with Metzenbaum scissors.  There was 2 to 4 cm of mobility of the bulbar urethra towards the patient's head.  I was very pleased with identification and mobilization. I placed a 3-0 Vicryl where the tendon was initially attached to the bulb  I was very diligent at the beginning of the case and throughout the case to feel the abductor tendon relative to the obturator foramen.  I marked the area of entrance of the foramen needle  with a marking pen.  I used 22 French foramen needle to find the inferior rami and then through the foramen itself below the tendon.  I kept the angle appropriate with the patient in Trendelenburg.  I made a scalpel incision on the patient's left side approximately 1 cm in length.  With appropriate exposure I placed my finger in the upper aspect of the triangle on the patient's left side.  With the described technique I passed a trocar initially placing it against the buttock at 45 degree angle.  With my thumb I did a double pop maneuver through the soft tissues layers and then dropping the handle and delivering the tip onto the pulp of my index finger on the patient's left side.  The foramen needle was easily passed and with correct orientation the mesh was attached.  I double checked the location of the needle and was excellent.  I gently rotated the trocar with the mesh sling coming back through the skin  I did the exact same procedure on the right.  The mesh was attached in correct orientation.  Gently pulling on both slings and with my finger between the bulb and the mesh I gently brought the mesh close to the bulbar urethra.  The 3-0 Vicryl that was initially placed through the sponge was brought out through the distal aspect of the mesh.  I then gently synched the mesh down to the bulb.  I placed two 3-0 Vicryl's in the proximal aspect of the mesh through the bulbospongiosus.  I then passed another in the midline.  I was very happy with the orientation of the mesh.  I then gently tensioned  the sling over the 18 Fr foley until it "clam-shelled." I then inserted the cystoscope to examine. Ultimately I tensioned the sling just a little more and I was very happy with the coaptation of the urethra and stopped at that point. A 14 fr catheter was then inserted.  Visually and palpably there was excellent rotation of the bulbar urethra.   The sling was cut below each blue dot.  It had been rolled by my  assistant prior to help for its release.  I irrigated.  I placed a hemostat deeply across the silicone and white sheath removing them easily.  This was done on both sides.  The mesh was cut below the skin level in the inguinal creases.  I closed each inguinal incision with 2 interrupted 4-0 Vicryl followed by Dermabond.  A 4 layer closure was used for the perineum.  I was cognizant of the scrotal aspect of the closure with 3-0 Vicryl that also included reapproximation of the bulbospongiosus muscle.  The next layer was deeper subcutaneous suture with 3-0 Vicryl.  A third layer was close to the skin with level with 3-0 Vicryl.  4-0 running mattress sutures used for the perineum.  Dermabond was applied with fluff dressing  The Foley catheter was draining well at the end of the case.  Leg position was good. I was very pleased with the surgery and hopefully it reaches the patient's treatment goal  Irine Seal MD 12/15/2022, 2:51 PM  [facility]  Pager: [phone]

## 2022-12-15 NOTE — Transfer of Care (Signed)
Immediate Anesthesia Transfer of Care Note  Patient: Ryan Hampton  Procedure(s) Performed: INSERTION OF MALE SLING  Patient Location: PACU  Anesthesia Type:General  Level of Consciousness: awake, alert , oriented, and patient cooperative  Airway & Oxygen Therapy: Patient Spontanous Breathing and Patient connected to face mask oxygen  Post-op Assessment: Report given to RN, Post -op Vital signs reviewed and stable, and Patient moving all extremities  Post vital signs: Reviewed and stable  Last Vitals:  Vitals Value Taken Time  BP 140/88 12/15/22 1506  Temp    Pulse 77 12/15/22 1509  Resp 15 12/15/22 1509  SpO2 98 % 12/15/22 1509  Vitals shown include unfiled device data.  Last Pain:  Vitals:   12/15/22 1017  TempSrc:   PainSc: 0-No pain         Complications: No notable events documented.

## 2022-12-15 NOTE — Anesthesia Procedure Notes (Signed)
Procedure Name: LMA Insertion Date/Time: 12/15/2022 1:20 PM  Performed by: Johnette Abraham, CRNAPre-anesthesia Checklist: Patient identified, Emergency Drugs available, Suction available and Patient being monitored Patient Re-evaluated:Patient Re-evaluated prior to induction Oxygen Delivery Method: Circle System Utilized Preoxygenation: Pre-oxygenation with 100% oxygen Induction Type: IV induction Ventilation: Mask ventilation without difficulty LMA: LMA with gastric port inserted LMA Size: 4.0 Number of attempts: 1 Airway Equipment and Method: Bite block Placement Confirmation: positive ETCO2 Tube secured with: Tape Dental Injury: Teeth and Oropharynx as per pre-operative assessment

## 2022-12-16 ENCOUNTER — Encounter (HOSPITAL_COMMUNITY): Payer: Self-pay | Admitting: Urology

## 2022-12-26 DIAGNOSIS — B372 Candidiasis of skin and nail: Secondary | ICD-10-CM | POA: Diagnosis not present

## 2023-01-04 DIAGNOSIS — B3749 Other urogenital candidiasis: Secondary | ICD-10-CM | POA: Diagnosis not present

## 2023-01-15 DIAGNOSIS — N393 Stress incontinence (female) (male): Secondary | ICD-10-CM | POA: Diagnosis not present

## 2023-01-26 DIAGNOSIS — L578 Other skin changes due to chronic exposure to nonionizing radiation: Secondary | ICD-10-CM | POA: Diagnosis not present

## 2023-01-26 DIAGNOSIS — L299 Pruritus, unspecified: Secondary | ICD-10-CM | POA: Diagnosis not present

## 2023-01-26 DIAGNOSIS — D2272 Melanocytic nevi of left lower limb, including hip: Secondary | ICD-10-CM | POA: Diagnosis not present

## 2023-01-26 DIAGNOSIS — L719 Rosacea, unspecified: Secondary | ICD-10-CM | POA: Diagnosis not present

## 2023-01-26 DIAGNOSIS — D2262 Melanocytic nevi of left upper limb, including shoulder: Secondary | ICD-10-CM | POA: Diagnosis not present

## 2023-01-26 DIAGNOSIS — L820 Inflamed seborrheic keratosis: Secondary | ICD-10-CM | POA: Diagnosis not present

## 2023-01-26 DIAGNOSIS — L814 Other melanin hyperpigmentation: Secondary | ICD-10-CM | POA: Diagnosis not present

## 2023-01-26 DIAGNOSIS — D2271 Melanocytic nevi of right lower limb, including hip: Secondary | ICD-10-CM | POA: Diagnosis not present

## 2023-01-26 DIAGNOSIS — L821 Other seborrheic keratosis: Secondary | ICD-10-CM | POA: Diagnosis not present

## 2023-01-26 DIAGNOSIS — L570 Actinic keratosis: Secondary | ICD-10-CM | POA: Diagnosis not present

## 2023-01-26 DIAGNOSIS — D225 Melanocytic nevi of trunk: Secondary | ICD-10-CM | POA: Diagnosis not present

## 2023-01-26 DIAGNOSIS — D2261 Melanocytic nevi of right upper limb, including shoulder: Secondary | ICD-10-CM | POA: Diagnosis not present

## 2023-02-20 DIAGNOSIS — L089 Local infection of the skin and subcutaneous tissue, unspecified: Secondary | ICD-10-CM | POA: Diagnosis not present

## 2023-02-20 DIAGNOSIS — W57XXXA Bitten or stung by nonvenomous insect and other nonvenomous arthropods, initial encounter: Secondary | ICD-10-CM | POA: Diagnosis not present

## 2023-02-20 DIAGNOSIS — S90862A Insect bite (nonvenomous), left foot, initial encounter: Secondary | ICD-10-CM | POA: Diagnosis not present

## 2023-03-09 DIAGNOSIS — Z8546 Personal history of malignant neoplasm of prostate: Secondary | ICD-10-CM | POA: Diagnosis not present

## 2023-03-16 DIAGNOSIS — N393 Stress incontinence (female) (male): Secondary | ICD-10-CM | POA: Diagnosis not present

## 2023-03-16 DIAGNOSIS — Z8546 Personal history of malignant neoplasm of prostate: Secondary | ICD-10-CM | POA: Diagnosis not present

## 2023-03-16 DIAGNOSIS — N5231 Erectile dysfunction following radical prostatectomy: Secondary | ICD-10-CM | POA: Diagnosis not present

## 2023-05-12 DIAGNOSIS — B009 Herpesviral infection, unspecified: Secondary | ICD-10-CM | POA: Diagnosis not present

## 2023-05-12 DIAGNOSIS — E782 Mixed hyperlipidemia: Secondary | ICD-10-CM | POA: Diagnosis not present

## 2023-05-12 DIAGNOSIS — F419 Anxiety disorder, unspecified: Secondary | ICD-10-CM | POA: Diagnosis not present

## 2023-05-24 IMAGING — US US ABDOMINAL AORTA SCREENING AAA
1 series · 8 of 8 positions shown · non-contrast
Comparison: 05/12/2004

CLINICAL DATA: Male between 65-75 years of age with a smoking
history.

EXAM:
US ABDOMINAL AORTA MEDICARE SCREENING
TECHNIQUE: Ultrasound examination of the abdominal aorta was performed as a
screening evaluation for abdominal aortic aneurysm.

[Series 1: us abdominal aorta screening aaa · 0.26mm/px · 8 of 8 slices shown]
[im 1/8]
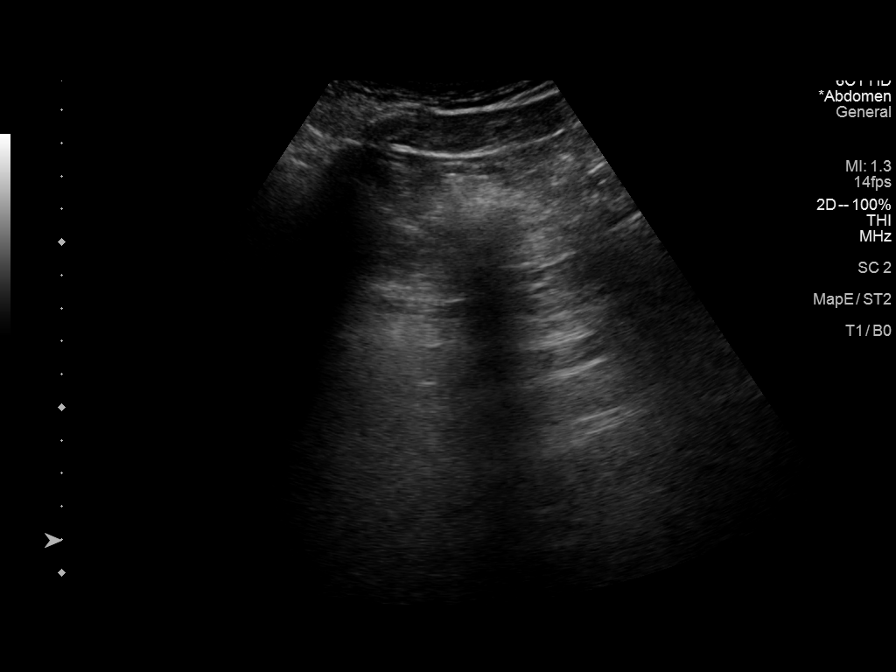
[im 2/8]
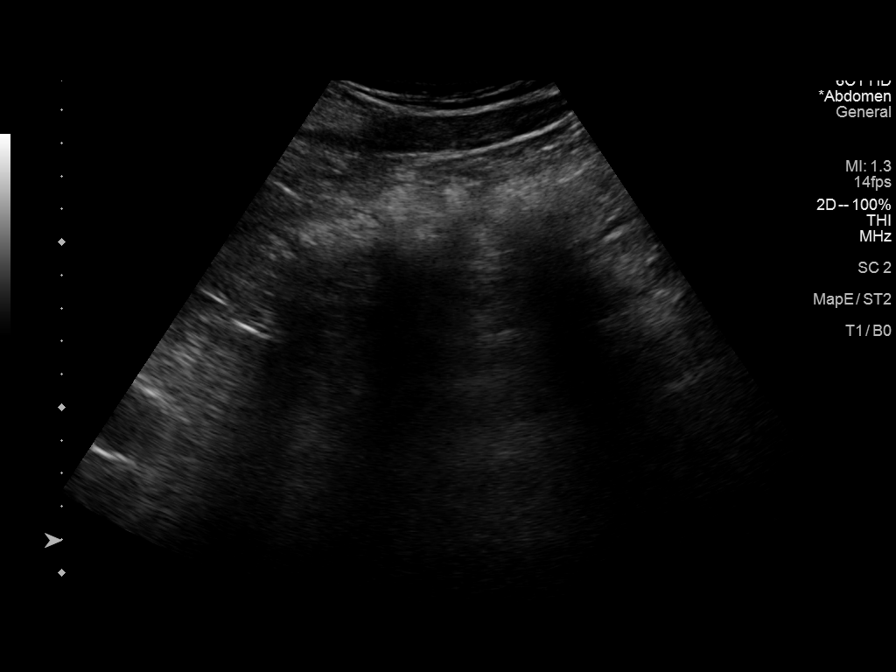
[im 3/8]
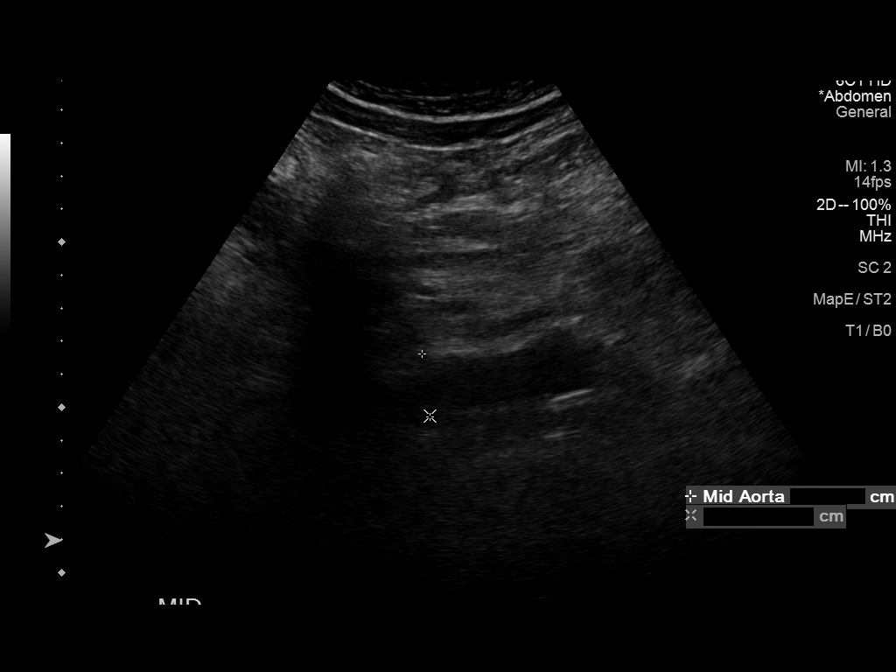
[im 4/8]
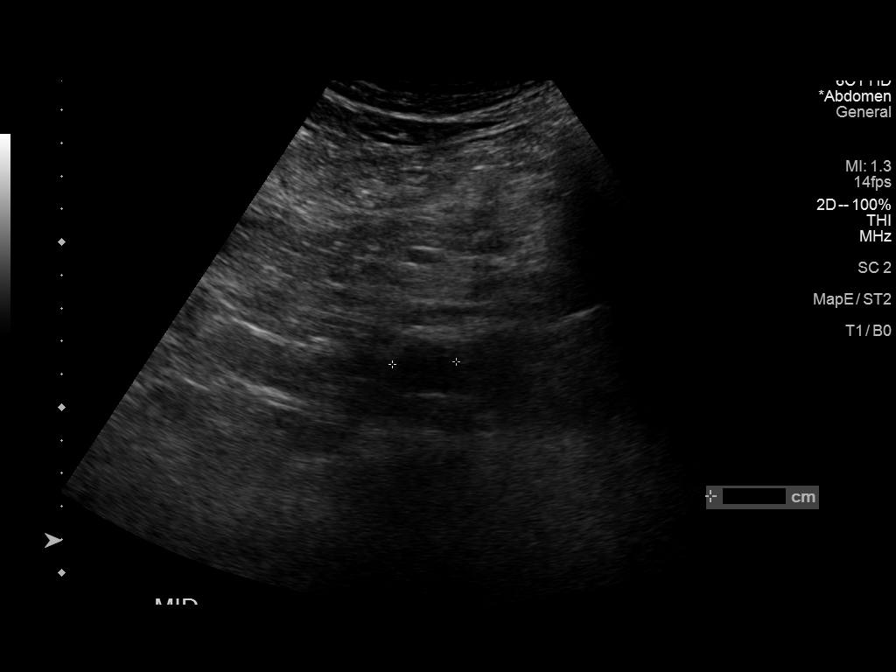
[im 5/8]
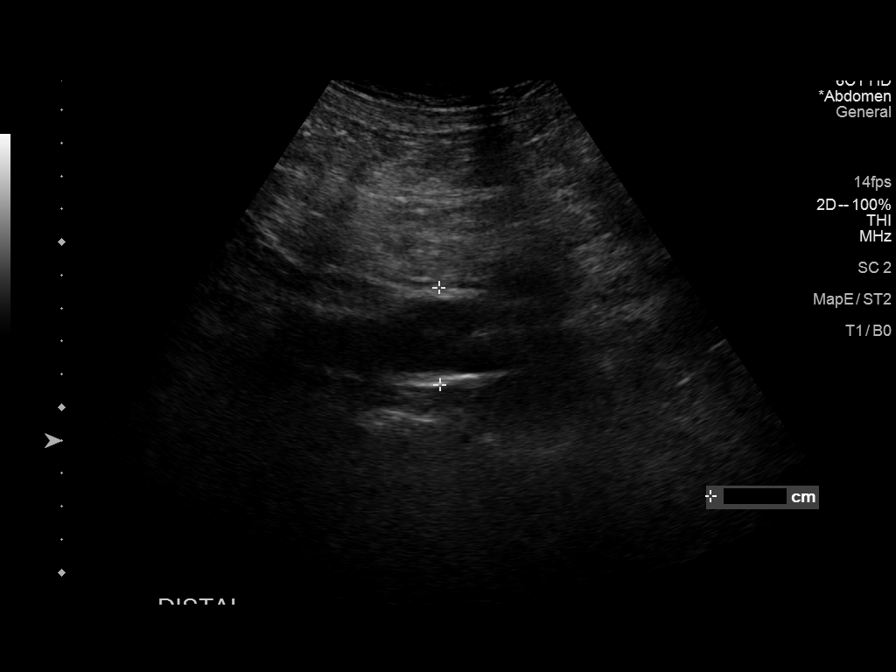
[im 6/8]
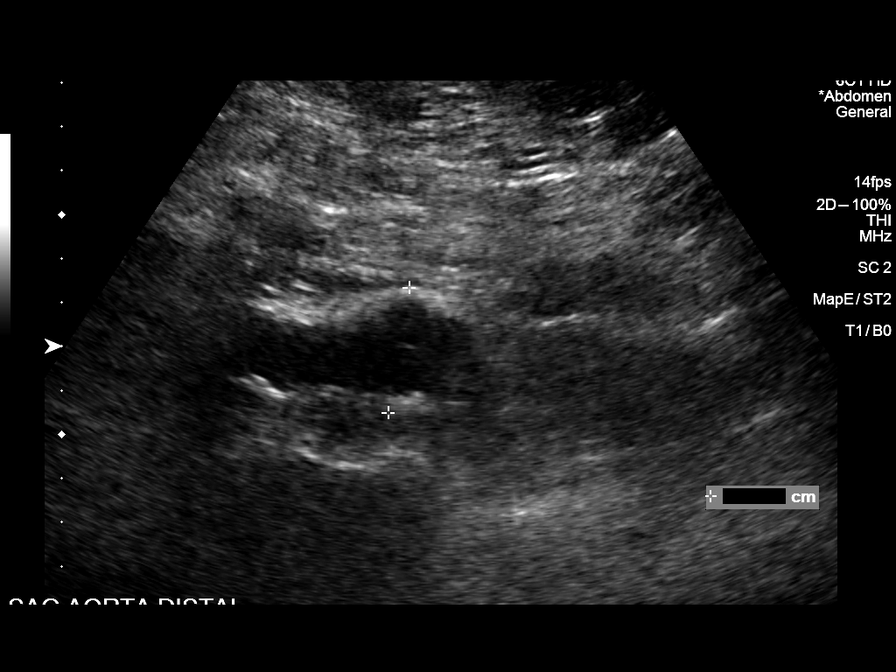
[im 7/8]
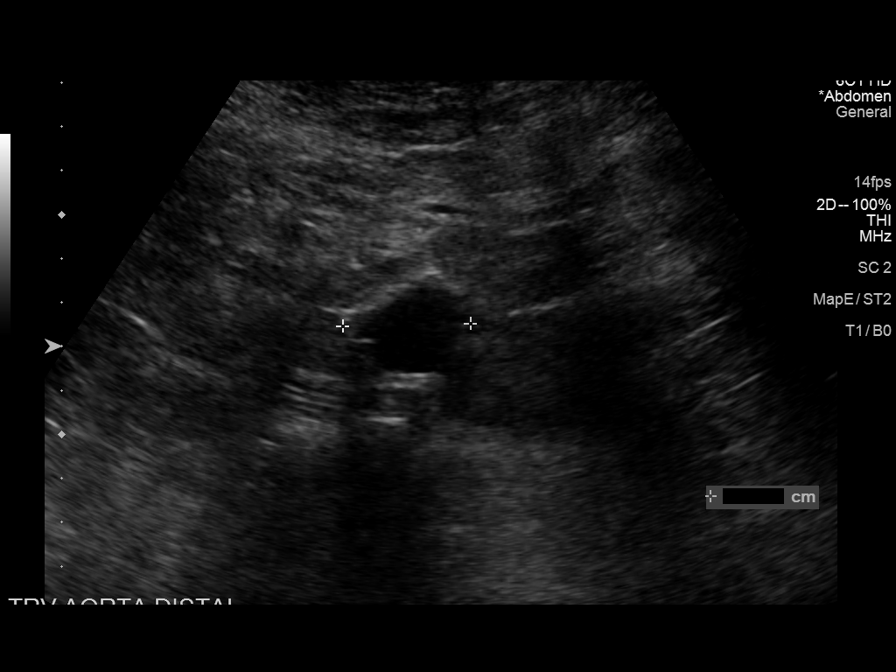
[im 8/8]
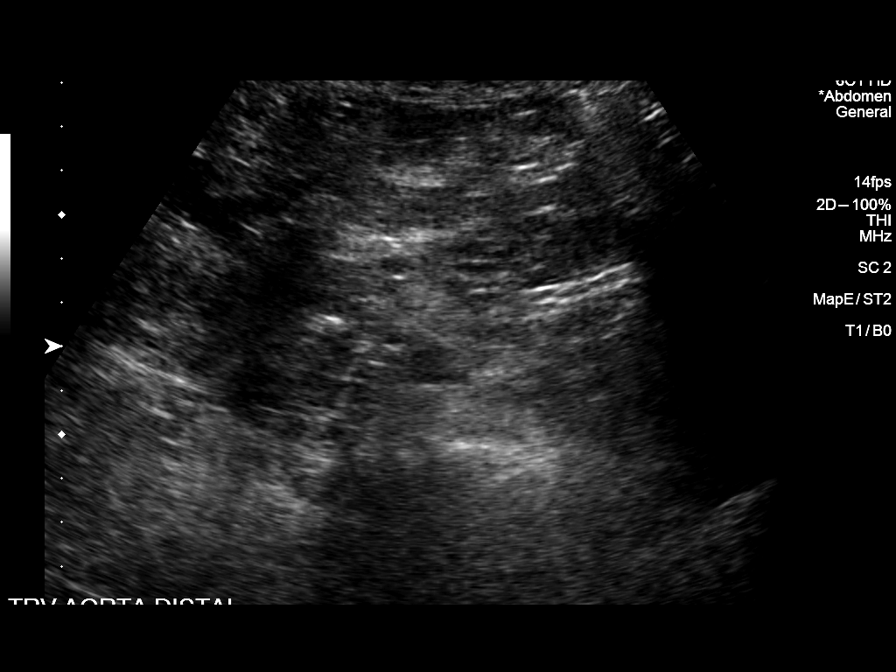

[8 of 8 positions shown; findings below may reference images not displayed]

FINDINGS: Abdominal aortic measurements as follows:

Proximal: Not well visualized

Mid:  1.9 cm cm

Distal:  2.9 cm cm
IMPRESSION: Dilatation of the distal aorta to 2.9 cm. Recommend follow-up
ultrasound every 5 years. This recommendation follows ACR consensus
guidelines: White Paper of the ACR Incidental Findings Committee II

## 2023-09-07 DIAGNOSIS — Z8546 Personal history of malignant neoplasm of prostate: Secondary | ICD-10-CM | POA: Diagnosis not present

## 2023-09-14 DIAGNOSIS — Z8546 Personal history of malignant neoplasm of prostate: Secondary | ICD-10-CM | POA: Diagnosis not present

## 2023-09-14 DIAGNOSIS — N393 Stress incontinence (female) (male): Secondary | ICD-10-CM | POA: Diagnosis not present

## 2023-09-14 DIAGNOSIS — N5231 Erectile dysfunction following radical prostatectomy: Secondary | ICD-10-CM | POA: Diagnosis not present

## 2023-11-29 DIAGNOSIS — C61 Malignant neoplasm of prostate: Secondary | ICD-10-CM | POA: Diagnosis not present

## 2023-11-29 DIAGNOSIS — G4700 Insomnia, unspecified: Secondary | ICD-10-CM | POA: Diagnosis not present

## 2023-11-29 DIAGNOSIS — F325 Major depressive disorder, single episode, in full remission: Secondary | ICD-10-CM | POA: Diagnosis not present

## 2023-11-29 DIAGNOSIS — I77810 Thoracic aortic ectasia: Secondary | ICD-10-CM | POA: Diagnosis not present

## 2023-11-29 DIAGNOSIS — R7301 Impaired fasting glucose: Secondary | ICD-10-CM | POA: Diagnosis not present

## 2023-11-29 DIAGNOSIS — J019 Acute sinusitis, unspecified: Secondary | ICD-10-CM | POA: Diagnosis not present

## 2023-11-29 DIAGNOSIS — E039 Hypothyroidism, unspecified: Secondary | ICD-10-CM | POA: Diagnosis not present

## 2023-11-29 DIAGNOSIS — Z79899 Other long term (current) drug therapy: Secondary | ICD-10-CM | POA: Diagnosis not present

## 2023-11-29 DIAGNOSIS — E782 Mixed hyperlipidemia: Secondary | ICD-10-CM | POA: Diagnosis not present

## 2023-11-29 DIAGNOSIS — Z0000 Encounter for general adult medical examination without abnormal findings: Secondary | ICD-10-CM | POA: Diagnosis not present

## 2024-01-18 DIAGNOSIS — H524 Presbyopia: Secondary | ICD-10-CM | POA: Diagnosis not present

## 2024-01-18 DIAGNOSIS — H02834 Dermatochalasis of left upper eyelid: Secondary | ICD-10-CM | POA: Diagnosis not present

## 2024-01-18 DIAGNOSIS — H5213 Myopia, bilateral: Secondary | ICD-10-CM | POA: Diagnosis not present

## 2024-01-18 DIAGNOSIS — H2513 Age-related nuclear cataract, bilateral: Secondary | ICD-10-CM | POA: Diagnosis not present

## 2024-01-18 DIAGNOSIS — H02831 Dermatochalasis of right upper eyelid: Secondary | ICD-10-CM | POA: Diagnosis not present

## 2024-01-18 DIAGNOSIS — H02054 Trichiasis without entropian left upper eyelid: Secondary | ICD-10-CM | POA: Diagnosis not present

## 2024-01-28 DIAGNOSIS — D2262 Melanocytic nevi of left upper limb, including shoulder: Secondary | ICD-10-CM | POA: Diagnosis not present

## 2024-01-28 DIAGNOSIS — L578 Other skin changes due to chronic exposure to nonionizing radiation: Secondary | ICD-10-CM | POA: Diagnosis not present

## 2024-01-28 DIAGNOSIS — L814 Other melanin hyperpigmentation: Secondary | ICD-10-CM | POA: Diagnosis not present

## 2024-01-28 DIAGNOSIS — L719 Rosacea, unspecified: Secondary | ICD-10-CM | POA: Diagnosis not present

## 2024-01-28 DIAGNOSIS — D2272 Melanocytic nevi of left lower limb, including hip: Secondary | ICD-10-CM | POA: Diagnosis not present

## 2024-01-28 DIAGNOSIS — D2271 Melanocytic nevi of right lower limb, including hip: Secondary | ICD-10-CM | POA: Diagnosis not present

## 2024-01-28 DIAGNOSIS — L570 Actinic keratosis: Secondary | ICD-10-CM | POA: Diagnosis not present

## 2024-01-28 DIAGNOSIS — D225 Melanocytic nevi of trunk: Secondary | ICD-10-CM | POA: Diagnosis not present

## 2024-01-28 DIAGNOSIS — D2261 Melanocytic nevi of right upper limb, including shoulder: Secondary | ICD-10-CM | POA: Diagnosis not present

## 2024-01-28 DIAGNOSIS — L820 Inflamed seborrheic keratosis: Secondary | ICD-10-CM | POA: Diagnosis not present

## 2024-01-28 DIAGNOSIS — L821 Other seborrheic keratosis: Secondary | ICD-10-CM | POA: Diagnosis not present

## 2024-04-24 DIAGNOSIS — H02031 Senile entropion of right upper eyelid: Secondary | ICD-10-CM | POA: Diagnosis not present

## 2024-04-24 DIAGNOSIS — H02041 Spastic entropion of right upper eyelid: Secondary | ICD-10-CM | POA: Diagnosis not present

## 2024-04-24 DIAGNOSIS — H02021 Mechanical entropion of right upper eyelid: Secondary | ICD-10-CM | POA: Diagnosis not present

## 2024-04-24 DIAGNOSIS — H0259 Other disorders affecting eyelid function: Secondary | ICD-10-CM | POA: Diagnosis not present

## 2024-04-24 DIAGNOSIS — H02011 Cicatricial entropion of right upper eyelid: Secondary | ICD-10-CM | POA: Diagnosis not present

## 2024-04-24 DIAGNOSIS — H02051 Trichiasis without entropian right upper eyelid: Secondary | ICD-10-CM | POA: Diagnosis not present

## 2024-04-24 DIAGNOSIS — H0279 Other degenerative disorders of eyelid and periocular area: Secondary | ICD-10-CM | POA: Diagnosis not present

## 2024-04-24 DIAGNOSIS — H02059 Trichiasis without entropian unspecified eye, unspecified eyelid: Secondary | ICD-10-CM | POA: Diagnosis not present

## 2024-04-24 DIAGNOSIS — Z01818 Encounter for other preprocedural examination: Secondary | ICD-10-CM | POA: Diagnosis not present

## 2024-04-24 DIAGNOSIS — H02531 Eyelid retraction right upper eyelid: Secondary | ICD-10-CM | POA: Diagnosis not present
